# Patient Record
Sex: Female | Born: 1965 | Race: White | Hispanic: No | Smoking: Current every day smoker
Health system: Southern US, Community
[De-identification: ages and names within clinical notes are randomized; demographics above are authoritative.]

## PROBLEM LIST (undated history)

## (undated) DIAGNOSIS — F319 Bipolar disorder, unspecified: Secondary | ICD-10-CM

## (undated) DIAGNOSIS — F329 Major depressive disorder, single episode, unspecified: Secondary | ICD-10-CM

## (undated) DIAGNOSIS — E7800 Pure hypercholesterolemia, unspecified: Secondary | ICD-10-CM

## (undated) DIAGNOSIS — F32A Depression, unspecified: Secondary | ICD-10-CM

## (undated) HISTORY — PX: NECK SURGERY: SHX720

## (undated) HISTORY — PX: CHOLECYSTECTOMY: SHX55

## (undated) HISTORY — PX: APPENDECTOMY: SHX54

## (undated) HISTORY — PX: KNEE SURGERY: SHX244

## (undated) HISTORY — PX: OTHER SURGICAL HISTORY: SHX169

---

## 1997-05-01 ENCOUNTER — Encounter: Admission: RE | Admit: 1997-05-01 | Discharge: 1997-07-30 | Payer: Self-pay | Admitting: Obstetrics and Gynecology

## 1997-09-13 ENCOUNTER — Encounter (HOSPITAL_COMMUNITY): Admission: RE | Admit: 1997-09-13 | Discharge: 1997-12-12 | Payer: Self-pay | Admitting: *Deleted

## 1997-12-28 ENCOUNTER — Encounter (HOSPITAL_COMMUNITY): Admission: RE | Admit: 1997-12-28 | Discharge: 1998-03-28 | Payer: Self-pay | Admitting: *Deleted

## 1998-01-01 ENCOUNTER — Inpatient Hospital Stay (HOSPITAL_COMMUNITY): Admission: AD | Admit: 1998-01-01 | Discharge: 1998-01-01 | Payer: Self-pay | Admitting: Obstetrics and Gynecology

## 1998-02-11 ENCOUNTER — Inpatient Hospital Stay (HOSPITAL_COMMUNITY): Admission: AD | Admit: 1998-02-11 | Discharge: 1998-02-14 | Payer: Self-pay | Admitting: Obstetrics and Gynecology

## 1998-05-01 ENCOUNTER — Other Ambulatory Visit: Admission: RE | Admit: 1998-05-01 | Discharge: 1998-05-01 | Payer: Self-pay | Admitting: Obstetrics and Gynecology

## 1998-05-03 ENCOUNTER — Encounter (HOSPITAL_COMMUNITY): Admission: RE | Admit: 1998-05-03 | Discharge: 1998-08-01 | Payer: Self-pay | Admitting: *Deleted

## 1998-08-01 ENCOUNTER — Encounter (HOSPITAL_COMMUNITY): Admission: RE | Admit: 1998-08-01 | Discharge: 1998-10-30 | Payer: Self-pay | Admitting: *Deleted

## 1999-03-17 ENCOUNTER — Emergency Department (HOSPITAL_COMMUNITY): Admission: EM | Admit: 1999-03-17 | Discharge: 1999-03-17 | Payer: Self-pay | Admitting: Emergency Medicine

## 1999-06-07 ENCOUNTER — Emergency Department (HOSPITAL_COMMUNITY): Admission: EM | Admit: 1999-06-07 | Discharge: 1999-06-07 | Payer: Self-pay | Admitting: *Deleted

## 1999-09-05 ENCOUNTER — Emergency Department (HOSPITAL_COMMUNITY): Admission: EM | Admit: 1999-09-05 | Discharge: 1999-09-05 | Payer: Self-pay | Admitting: Pediatrics

## 1999-09-26 ENCOUNTER — Emergency Department (HOSPITAL_COMMUNITY): Admission: EM | Admit: 1999-09-26 | Discharge: 1999-09-26 | Payer: Self-pay | Admitting: *Deleted

## 2000-02-24 ENCOUNTER — Emergency Department (HOSPITAL_COMMUNITY): Admission: EM | Admit: 2000-02-24 | Discharge: 2000-02-24 | Payer: Self-pay | Admitting: Emergency Medicine

## 2000-03-02 ENCOUNTER — Emergency Department (HOSPITAL_COMMUNITY): Admission: EM | Admit: 2000-03-02 | Discharge: 2000-03-02 | Payer: Self-pay | Admitting: Emergency Medicine

## 2001-02-08 ENCOUNTER — Inpatient Hospital Stay (HOSPITAL_COMMUNITY): Admission: AC | Admit: 2001-02-08 | Discharge: 2001-02-15 | Payer: Self-pay

## 2001-02-08 ENCOUNTER — Encounter: Payer: Self-pay | Admitting: General Surgery

## 2001-02-08 ENCOUNTER — Encounter: Payer: Self-pay | Admitting: Orthopedic Surgery

## 2001-03-25 ENCOUNTER — Ambulatory Visit (HOSPITAL_BASED_OUTPATIENT_CLINIC_OR_DEPARTMENT_OTHER): Admission: RE | Admit: 2001-03-25 | Discharge: 2001-03-26 | Payer: Self-pay | Admitting: Orthopedic Surgery

## 2002-10-11 ENCOUNTER — Inpatient Hospital Stay (HOSPITAL_COMMUNITY): Admission: AD | Admit: 2002-10-11 | Discharge: 2002-10-12 | Payer: Self-pay | Admitting: *Deleted

## 2002-10-12 ENCOUNTER — Encounter: Payer: Self-pay | Admitting: *Deleted

## 2004-05-05 ENCOUNTER — Emergency Department (HOSPITAL_COMMUNITY): Admission: EM | Admit: 2004-05-05 | Discharge: 2004-05-06 | Payer: Self-pay | Admitting: Emergency Medicine

## 2004-12-21 ENCOUNTER — Emergency Department (HOSPITAL_COMMUNITY): Admission: EM | Admit: 2004-12-21 | Discharge: 2004-12-21 | Payer: Self-pay

## 2005-01-20 ENCOUNTER — Emergency Department (HOSPITAL_COMMUNITY): Admission: EM | Admit: 2005-01-20 | Discharge: 2005-01-20 | Payer: Self-pay | Admitting: Emergency Medicine

## 2006-09-14 ENCOUNTER — Emergency Department (HOSPITAL_COMMUNITY): Admission: EM | Admit: 2006-09-14 | Discharge: 2006-09-14 | Payer: Self-pay | Admitting: Emergency Medicine

## 2006-12-03 ENCOUNTER — Inpatient Hospital Stay (HOSPITAL_COMMUNITY): Admission: AD | Admit: 2006-12-03 | Discharge: 2006-12-03 | Payer: Self-pay | Admitting: Obstetrics and Gynecology

## 2006-12-04 ENCOUNTER — Ambulatory Visit (HOSPITAL_COMMUNITY): Admission: RE | Admit: 2006-12-04 | Discharge: 2006-12-04 | Payer: Self-pay | Admitting: Obstetrics and Gynecology

## 2006-12-04 ENCOUNTER — Encounter (INDEPENDENT_AMBULATORY_CARE_PROVIDER_SITE_OTHER): Payer: Self-pay | Admitting: Obstetrics and Gynecology

## 2006-12-21 ENCOUNTER — Ambulatory Visit (HOSPITAL_COMMUNITY): Admission: RE | Admit: 2006-12-21 | Discharge: 2006-12-21 | Payer: Self-pay | Admitting: General Surgery

## 2006-12-21 ENCOUNTER — Encounter (INDEPENDENT_AMBULATORY_CARE_PROVIDER_SITE_OTHER): Payer: Self-pay | Admitting: General Surgery

## 2007-11-02 ENCOUNTER — Ambulatory Visit: Payer: Self-pay | Admitting: Internal Medicine

## 2007-11-02 DIAGNOSIS — R198 Other specified symptoms and signs involving the digestive system and abdomen: Secondary | ICD-10-CM

## 2007-11-02 DIAGNOSIS — R109 Unspecified abdominal pain: Secondary | ICD-10-CM

## 2007-11-03 ENCOUNTER — Encounter (INDEPENDENT_AMBULATORY_CARE_PROVIDER_SITE_OTHER): Payer: Self-pay

## 2007-11-03 LAB — CONVERTED CEMR LAB
AST: 20 units/L (ref 0–37)
Albumin: 4 g/dL (ref 3.5–5.2)
BUN: 11 mg/dL (ref 6–23)
Basophils Relative: 0.1 % (ref 0.0–3.0)
CO2: 27 meq/L (ref 19–32)
Calcium: 9.2 mg/dL (ref 8.4–10.5)
Chloride: 106 meq/L (ref 96–112)
Creatinine, Ser: 0.8 mg/dL (ref 0.4–1.2)
Eosinophils Absolute: 0.1 10*3/uL (ref 0.0–0.7)
Eosinophils Relative: 1.3 % (ref 0.0–5.0)
GFR calc Af Amer: 103 mL/min
Glucose, Bld: 116 mg/dL — ABNORMAL HIGH (ref 70–99)
Lymphocytes Relative: 26.7 % (ref 12.0–46.0)
Monocytes Relative: 4.7 % (ref 3.0–12.0)
Neutrophils Relative %: 67.2 % (ref 43.0–77.0)
Platelets: 250 10*3/uL (ref 150–400)
Potassium: 4.3 meq/L (ref 3.5–5.1)
RBC: 4.09 M/uL (ref 3.87–5.11)
TSH: 1.62 microintl units/mL (ref 0.35–5.50)
WBC: 7.6 10*3/uL (ref 4.5–10.5)

## 2007-11-29 ENCOUNTER — Ambulatory Visit: Payer: Self-pay | Admitting: Internal Medicine

## 2009-02-13 ENCOUNTER — Encounter: Admission: RE | Admit: 2009-02-13 | Discharge: 2009-03-29 | Payer: Self-pay | Admitting: Orthopaedic Surgery

## 2009-08-04 ENCOUNTER — Emergency Department (HOSPITAL_BASED_OUTPATIENT_CLINIC_OR_DEPARTMENT_OTHER): Admission: EM | Admit: 2009-08-04 | Discharge: 2009-08-05 | Payer: Self-pay | Admitting: Emergency Medicine

## 2010-02-28 ENCOUNTER — Ambulatory Visit (HOSPITAL_COMMUNITY)
Admission: RE | Admit: 2010-02-28 | Discharge: 2010-02-28 | Payer: Self-pay | Source: Home / Self Care | Admitting: Obstetrics and Gynecology

## 2010-04-17 ENCOUNTER — Encounter
Admission: RE | Admit: 2010-04-17 | Discharge: 2010-04-30 | Payer: Self-pay | Source: Home / Self Care | Attending: Obstetrics and Gynecology | Admitting: Obstetrics and Gynecology

## 2010-05-07 ENCOUNTER — Emergency Department (HOSPITAL_BASED_OUTPATIENT_CLINIC_OR_DEPARTMENT_OTHER)
Admission: EM | Admit: 2010-05-07 | Discharge: 2010-05-07 | Disposition: A | Discharge status: Home / Self Care | Payer: Medicaid Other | Attending: Emergency Medicine | Admitting: Emergency Medicine

## 2010-05-07 ENCOUNTER — Emergency Department (INDEPENDENT_AMBULATORY_CARE_PROVIDER_SITE_OTHER): Payer: Medicaid Other

## 2010-05-07 DIAGNOSIS — M5030 Other cervical disc degeneration, unspecified cervical region: Secondary | ICD-10-CM | POA: Insufficient documentation

## 2010-05-07 DIAGNOSIS — M25519 Pain in unspecified shoulder: Secondary | ICD-10-CM | POA: Insufficient documentation

## 2010-05-07 DIAGNOSIS — M19019 Primary osteoarthritis, unspecified shoulder: Secondary | ICD-10-CM | POA: Insufficient documentation

## 2010-05-07 DIAGNOSIS — M542 Cervicalgia: Secondary | ICD-10-CM

## 2010-05-07 DIAGNOSIS — G5690 Unspecified mononeuropathy of unspecified upper limb: Secondary | ICD-10-CM | POA: Insufficient documentation

## 2010-05-07 DIAGNOSIS — R209 Unspecified disturbances of skin sensation: Secondary | ICD-10-CM | POA: Insufficient documentation

## 2010-05-15 ENCOUNTER — Ambulatory Visit: Payer: Self-pay | Admitting: *Deleted

## 2010-05-17 ENCOUNTER — Emergency Department (HOSPITAL_BASED_OUTPATIENT_CLINIC_OR_DEPARTMENT_OTHER)
Admission: EM | Admit: 2010-05-17 | Discharge: 2010-05-17 | Disposition: A | Discharge status: Home / Self Care | Payer: Medicaid Other | Attending: Emergency Medicine | Admitting: Emergency Medicine

## 2010-05-17 DIAGNOSIS — M25519 Pain in unspecified shoulder: Secondary | ICD-10-CM | POA: Insufficient documentation

## 2010-05-17 DIAGNOSIS — R209 Unspecified disturbances of skin sensation: Secondary | ICD-10-CM | POA: Insufficient documentation

## 2010-08-13 NOTE — H&P (Signed)
[name], SCOTTI NO.:  [id number]   MEDICAL RECORD NO.:  [id number]           PATIENT TYPE:  AMB   LOCATION:  DAY                           FACILITY:  [facility]   PHYSICIAN:  Dalia Heading, M.D.  DATE OF BIRTH:  06/02/1965   DATE OF ADMISSION:  DATE OF DISCHARGE:  LH                              HISTORY & PHYSICAL   CHIEF COMPLAINT:  Cholecystitis, cholelithiasis.   HISTORY OF PRESENT ILLNESS:  The patient is a 45 year old white female  who is referred for evaluation and treatment of biliary colic secondary  to cholelithiasis.  She has been having right upper quadrant abdominal  pain, nausea, and bloating for many years.  She does have fatty food  intolerance.  No fever, chills, jaundice have been noted.   PAST MEDICAL HISTORY:  Includes a recent miscarriage.   SURGICAL HISTORY:  Appendectomy, c-section, dilatation and extraction  recently, arm and knee surgeries.   CURRENT MEDICATIONS:  None.   ALLERGIES:  NO KNOWN DRUG ALLERGIES.   REVIEW OF SYSTEMS:  The patient smokes a pack of cigarettes a day.  She  denies any alcohol abuse.  She denies any other cardiopulmonary  difficulties or bleeding disorders.   PHYSICAL EXAMINATION:  GENERAL:  The patient is a well-developed, well-  nourished white female in no acute distress.  HEENT:  Reveals no scleral icterus.  LUNGS:  Clear to auscultation with equal breath sounds bilaterally.  HEART:  Examination reveals a regular rate and rhythm without S3, S4 or  murmurs.  ABDOMEN:  Soft and nondistended.  She is tender in the right upper  quadrant to palpation.  No hepatosplenomegaly, masses, or hernias are  identified.  Ultrasound of the gallbladder reveals cholelithiasis with a  dilated common bile duct.   IMPRESSION:  Cholecystitis, cholelithiasis.   PLAN:  The patient is scheduled for a laparoscopic cholecystectomy with  intraoperative cholangiograms at December 21, 2006.  The risks and  benefits of the  procedure including bleeding, infection, hepatobiliary  injury and the possibility of an open procedure were fully explained to  the patient, gave informed consent.      Dalia Heading, M.D.  Electronically Signed     MAJ/MEDQ  D:  12/15/2006  T:  12/16/2006  Job:  [id number]   cc:   Dalia Heading, M.D.  Fax: [phone]

## 2010-08-13 NOTE — Op Note (Signed)
[name], [name]                  ACCOUNT NO.:  [id number]   MEDICAL RECORD NO.:  [id number]          PATIENT TYPE:  AMB   LOCATION:  SDC                           FACILITY:  [facility]   PHYSICIAN:  Dineen Kid. Rana Snare, M.D.    DATE OF BIRTH:  April 18, 1965   DATE OF PROCEDURE:  12/04/2006  DATE OF DISCHARGE:                               OPERATIVE REPORT   PREOPERATIVE DIAGNOSIS:  Embryonic demise of 6.5 weeks' gestational age.   POSTOPERATIVE DIAGNOSIS:  Embryonic demise of 6.5 weeks' gestational  age.   PROCEDURE:  Dilation evacuation.   SURGEON:  Candice Camp, MD.   ANESTHESIA:  Monitored anesthesia care and paracervical block.   INDICATIONS FOR PROCEDURE:  The patient is a 45 year old G7, P3, A2 at  6.5 weeks' gestational age who presents with vaginal bleeding.  Ultrasound shows embryonic demise. She desires dilation/evacuation. The  risks and benefits were discussed at length. Informed consent was  obtained. She is Rh positive.   DESCRIPTION OF PROCEDURE:  After adequate analgesia, the patient was  placed in the dorsal lithotomy position. She was sterilely prepped and  draped, bladder sterilely drained. Speculum was placed. Tenaculum placed  on the anterior lip of the cervix. Paracervical block was placed with 1%  Xylocaine, 1:100,000 epinephrine, 20 mL total used.   The uterus was sounded to 10 cm, easily dilated to a #27 Pratt dilator,  8-mm suction curet was inserted, products of conception were retrieved,  curettage was performed until gritty surface felt throughout the  endometrial cavity and no residual products of conception being  retrieved.   The patient received 0.2 mg of Methergine IM with good uterine response.  The tenaculum was then removed from the cervix, noted to be hemostatic.  The speculum was removed. The patient was transferred to the recovery  room in stable condition. Sponge, instrument counts were normal x3.   ESTIMATED BLOOD LOSS:  Minimal.   The patient  received Toradol 30 mg IV postoperatively   DISPOSITION:  The patient will be discharged home. Will follow up in the  office in 2 to 3 weeks. Sent home with a routine instruction sheet for D  and E. Told to return for increased pain, fever or bleeding. Sent home  with a  prescription for Methergine 0.2 mg to take every 8 hours for 3 days,  doxycycline 100 mg orally b.i.d. x7 days and Vicodin to take as needed  and ibuprofen for pain. I did give her 30 of these because she is also  having cholestasis and is supposed to follow up with general surgeon for  evaluation of that.      Dineen Kid Rana Snare, M.D.  Electronically Signed     DCL/MEDQ  D:  12/04/2006  T:  12/04/2006  Job:  [id number]

## 2010-08-13 NOTE — Op Note (Signed)
[name], [name] NO.:  [id number]   MEDICAL RECORD NO.:  [id number]          PATIENT TYPE:  AMB   LOCATION:  DAY                           FACILITY:  [facility]   PHYSICIAN:  Dalia Heading, M.D.  DATE OF BIRTH:  10/04/1965   DATE OF PROCEDURE:  DATE OF DISCHARGE:                               OPERATIVE REPORT   PREOPERATIVE DIAGNOSIS:  Cholecystitis, cholelithiasis.   POSTOPERATIVE DIAGNOSIS:  Cholecystitis, cholelithiasis.   PROCEDURE:  Laparoscopic cholecystectomy with cholangiograms.   SURGEON:  Dalia Heading, M.D.   ANESTHESIA:  General endotracheal.   INDICATIONS:  The patient is a 45 year old white female who presents  with cholecystitis secondary to cholelithiasis.  She also has a dilated  common bile duct.  The patient now presents for laparoscopic  cholecystectomy with cholangiograms.  The risks and benefits of the  procedure including bleeding, infection, hepatobiliary injury, and the  possibly an open procedure were fully explained to the patient who gave  informed consent.   PROCEDURE NOTE:  The patient was placed in the supine position.  After  induction of general endotracheal anesthesia, the abdomen was prepped  and draped using the usual sterile technique with Betadine.  Surgical  site confirmation was performed.   A supraumbilical incision was made down to the fascia.  A Veress needle  was introduced into the abdominal cavity and confirmation of placement  was done using the saline drop test.  The abdomen was then insufflated  to 16 mmHg pressure.  An 11-mm trocar was introduced into the abdominal  cavity under direct visualization without difficulty.  The patient was  placed in reversed Trendelenburg position, and an additional 11-mm  trocar was placed in the epigastric region and 5-mm trocars placed in  the right upper quadrant and right flank regions.  The liver was  inspected and noted to be within normal limits.  The gallbladder  was  retracted superiorly and  laterally.  The dissection was begun around  the infundibulum of the gallbladder.  The cystic duct was first  identified.  Its juncture to the infundibulum fully identified.  A  single endoclip was placed proximally on the cystic duct.  An incision  was made into the cystic duct and the cholangiocatheter was inserted.  Under digital fluoroscopy, cholangiograms were performed.  The dye  flowed freely into the duodenum.  There were no hepatobiliary defects.  The system was flushed with normal saline and the catheter was removed.  Multiple endoclips were placed distally on the cystic duct and the  cystic duct was divided.  The cystic artery was likewise ligated and  divided.  The gallbladder was freed away from the gallbladder fossa  using Bovie electrocautery.  The gallbladder was delivered through the  epigastric trocar site using an EndoCatch bag.  The gallbladder fossa  was inspected.  No abnormal bleeding or bile leakage was noted.  Surgicel was placed in the gallbladder fossa.  All fluid and air were  then evacuated from the abdominal cavity prior to removal of the  trocars.   All wounds were  irrigated with normal saline.  All wounds were injected  with 0.5% Sensorcaine.  The supraumbilical fascia was reapproximated  using an 0 Vicryl interrupted suture.  All skin incisions were closed  using staples.  Betadine ointment and dry sterile dressings were  applied.   All tape and needle counts were correct at the end of the procedure.  The patient was extubated in the operating room and went back to the  recovery room awake and in stable condition.   COMPLICATIONS:  None.   SPECIMEN:  Gallbladder with stones.   ESTIMATED BLOOD LOSS:  Minimal.      Dalia Heading, M.D.  Electronically Signed     MAJ/MEDQ  D:  12/21/2006  T:  12/21/2006  Job:  [id number]

## 2010-08-16 NOTE — Op Note (Signed)
[place]. [facility]  Patient:    [name], [name] Visit Number: [id number] MRN: [id number]          Service Type: DSU Location: [facility] Attending Physician:  Burnard Bunting Dictated by:   Cammy Copa, M.D. Proc. Date: 03/25/01 Admit Date:  03/25/2001                             Operative Report  PREOPERATIVE DIAGNOSIS:  Right knee anterior cruciate ligament tear with a partial medial collateral ligament sprain.  POSTOPERATIVE DIAGNOSIS:  Right knee anterior cruciate ligament tear, medial collateral ligament sprain grade 1 and patellofemoral chondromalacia.  OPERATION PERFORMED:  Right knee anterior cruciate ligament reconstruction with allograft and debridement of undersurface of the patella.  SURGEON:  Cammy Copa, M.D.  ANESTHESIA:  General endotracheal plus postoperative femoral block.  TOURNIQUET TIME:  One hour 28 minutes at 300 mHg.  OPERATIVE FINDINGS:  Examination under anesthesia, range of motion 3 degrees hyperextension, 2 full flexion with about 2 to 3 mm of opening to varus stress at 0 degrees, about 4 to 5 mm of opening at 30 degrees.  LCL and PCL were intact.  The patient had positive pivot shift and Lachman with greater than 5 mm anterior translation of soft end point.  Diagnostic arthroscopy: 1. Grade 2 chondromalacia on the undersurface of the patella over a    7 x 7 mm area at the junction of the medial and lateral facets. 2. Intact trochlear articular cartilage. 3. No loose bodies in the medial and lateral gutter. 4. Complete anterior cruciate ligament tear with detachment off of the    femur. 5. Intact posterior cruciate ligament. 6. Partial thickness lateral meniscal tear which was stable and posterior    to the popliteus hiatus.  Lateral compartment cartilage was intact. 7. Intact medial meniscus with intact medial compartment articular cartilage.  DESCRIPTION OF PROCEDURE:  The patient was taken to the  operating room where general endotracheal anesthesia was induced.  Preoperative intravenous antibiotics were administered.  The right leg was examined under anesthesia. The right leg including the foot was prepped with DuraPrep solution and draped in a sterile manner.  Based on the preoperative exam, history and intraoperative examination, a patella tendon allograft was prepared.  The allograft had a tendon which was 11 to 12 mm thick but the bone plugs were fashioned to be 9 mm thick x 20 mm.  The two #5 fiberwire sutures were placed through what would be the tibial side and one #5 Ethibond was placed through the nose of what would be the femoral bone plug.  The anterior inferolateral portal was first established.  Anterior inferior medial portal was then established under direct visualization.  Debridement of the intercondylar notch was performed.  The anterior cruciate ligament stump was identified and was debrided.  Soft tissue from the lateral wall of the notch was debrided and the over-the-top position was identified.  Using a bur the notch was changed from an A to a U and about 2 to 3 mm of the lateral wall was removed.  The over-the-top position was again identified.  The remaining aspect of the knee was examined arthroscopically and the rest of the knee was examined arthroscopically with the only major findings being some grade 2 chondromalacia on the undersurface of the patella with no corresponding trochlear changes.  The medial and lateral gutters were free of loose bodies, the partial  thickness lateral meniscal tear which posterior to the popliteus. The medial meniscus was intact and the medial and lateral compartment articular cartilage was intact.  The stump of the anterior cruciate ligament was debrided.  Using a tibial guide set on 50 mm, the tibial tunnel was drilled at the posterior aspect of the native ACL footprint.  The 7 mm over-the-top guide was then placed with the  notch in the over-the-top position.  The 9 mm reamer which was used for the tibia was then used for the femur.  The Beeth pin was then passed and the graft was then passed into the tunnel. The fixation was achieved on the femoral side with a 7 x 23 mm interference screw, bioabsorbable.  The knee was taken through a range of motion and the graft was found not to impinge and was found to have excellent stability with negative anesthesia check for graft pull-out.  The leg was then taken through about 10 degrees of flexion and secured over a 25 mm tibial post with the two fiberwire sutures.  Recheck on the graft demonstrated good tension and full range of motion.  At this time the suture was removed from the proximal aspect of the thigh and the sutures were cut distally.  The incision and the knee were thoroughly irrigated.  The tourniquet was released and the incisions were closed using 2-0 Vicryl in the portals, 2-0 inverted Vicryl in the portals and the small skin incision superior to the path using the screw and the skin was closed using 3-0 Prolene.  The patient tolerated the procedure well and had perfused foot at the conclusion of the case. Femoral block was administered.  A knee immobilizer was placed.  The patient was transferred to the recovery room in stable condition. Dictated by:   Cammy Copa, M.D. Attending Physician:  Burnard Bunting DD:  03/25/01 TD:  03/25/01 Job: [id number]
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
## 2010-08-16 NOTE — H&P (Signed)
[name], [name] NO.:  [id number]   MEDICAL RECORD NO.:  [id number]          PATIENT TYPE:  AMB   LOCATION:  DAY                           FACILITY:  [facility]   PHYSICIAN:  Dalia Heading, M.D.  DATE OF BIRTH:  Nov 20, 1965   DATE OF ADMISSION:  DATE OF DISCHARGE:  LH                              HISTORY & PHYSICAL   CHIEF COMPLAINT:  Cholecystitis, cholelithiasis.   HISTORY OF PRESENT ILLNESS:  The patient is a 45 year old white female  who is referred for evaluation and treatment of biliary colic secondary  to cholelithiasis.  She has been having right upper quadrant abdominal  pain, nausea, and bloating for many years.  She does have fatty food  intolerance.  No fever, chills, jaundice have been noted.   PAST MEDICAL HISTORY:  Includes a recent miscarriage.   SURGICAL HISTORY:  Appendectomy, c-section, dilatation and extraction  recently, arm and knee surgeries.   CURRENT MEDICATIONS:  None.   ALLERGIES:  NO KNOWN DRUG ALLERGIES.   REVIEW OF SYSTEMS:  The patient smokes a pack of cigarettes a day.  She  denies any alcohol abuse.  She denies any other cardiopulmonary  difficulties or bleeding disorders.   PHYSICAL EXAMINATION:  GENERAL:  The patient is a well-developed, well-  nourished white female in no acute distress.  HEENT:  Reveals no scleral icterus.  LUNGS:  Clear to auscultation with equal breath sounds bilaterally.  HEART:  Examination reveals a regular rate and rhythm without S3, S4 or  murmurs.  ABDOMEN:  Soft and nondistended.  She is tender in the right upper  quadrant to palpation.  No hepatosplenomegaly, masses, or hernias are  identified.  Ultrasound of the gallbladder reveals cholelithiasis with a  dilated common bile duct.   IMPRESSION:  Cholecystitis, cholelithiasis.   PLAN:  The patient is scheduled for a laparoscopic cholecystectomy with  intraoperative cholangiograms at December 21, 2006.  The risks and  benefits of the  procedure including bleeding, infection, hepatobiliary  injury and the possibility of an open procedure were fully explained to  the patient, gave informed consent.      Dalia Heading, M.D.  Electronically Signed     MAJ/MEDQ  D:  12/15/2006  T:  12/16/2006  Job:  [id number]   cc:   Dalia Heading, M.D.  Fax: [phone]

## 2010-08-16 NOTE — Op Note (Signed)
[place]  Patient:    Marylin Crosby Visit Number: [id number] MRN: [id number]          Service Type: TRA Location: [id number] Attending Physician:  Trauma, Md Dictated by:   Cammy Copa, M.D. Proc. Date: 02/08/01 Admit Date:  02/08/2001                             Operative Report  PREOPERATIVE DIAGNOSIS:  Left radius fracture.  POSTOPERATIVE DIAGNOSIS:  Left radius fracture.  OPERATION: Open reduction and internal fixation of the left radius fracture.  SURGEON:  Cammy Copa, M.D.  ANESTHESIA:  General endotracheal.  ESTIMATED BLOOD LOSS:  25 cc.  DRAINS:  None.  TOURNIQUET TIME:  One hour 16 minutes at 250 mmHg.  DESCRIPTION OF PROCEDURE:  The patient was brought to the operating room where a general endotracheal anesthetic was induced.  Preoperative IV antibiotics were administered.  The left forearm was prescrubbed with Betadine and then dried, and then prepped with DuraPrep solution and draped in a sterile manner. Proximal arm tourniquet was used.  The Collier Flowers was used to cover the operative field.  The arm was elevated but not exsanguinated, and the tourniquet was inflated.  This preserved the venous comitantes for radial artery identification.  The volar Sherilyn Cooter approach was utilized.  A 15 cm incision was made, centered over the fracture.  The skin and subcutaneous tissue was sharply divided.  The superficial nerve was identified and was mobilized laterally.  Brachial radialis interval between the brachial radialis and the FCR was identified distally within the incision.  Dissection was then carried proximally.  The fascia was divided in the plane between the FCR and the brachial radialis and more proximally between the brachial radialis and pronator.  Hematoma was encountered.  Superficial radial nerve was identified underneath the brachial radialis.  It was not dissected and was protected during the case.   The radial artery was identified also and was mobilized ulnarly.  Communicating branches to the brachial radialis were cauterized with bipolar electrocautery.  The distal fragment edge was identified.  The arm was pronated and the pronator terres was subperiosteally elevated.  This gave a very good view of the distal end of the fragment fracture.  The proximal end was then supinated and the bicipital tuberosity was palpated.  The supinator was then detached by first palpating the bursae between the bicipital attachment to the radial tuberosity was palpated.  It was then developed, and the supinator was detached and mobilized laterally.  The radial artery was protected during this maneuver.  Careful subperiosteal dissection was performed.  The retractors were not placed on the posterior aspect of the radial neck or the radius.  At this time, the fracture was anatomically reduced.  There was a small fragment measuring 3 x 4 mm which was teased back into position.  Using standard AS technique, six cortices were achieved proximally, eight cortices were achieved distally.  The reduction was anatomic on the AP and lateral x-ray.  The screws were not too prominent on the dorsal aspect of the forearm.  The arm was taken through a range of motion.  The distal radial ulnar joint was felt to be stable.  The patient had full pronation and supination.  At this time, the incision was irrigated, and the tourniquet was released.  Bleeding points were controlled using electrocautery and radial artery pulse nicely.  The fascia  was not closed, and the skin was then closed using interrupted inverted 2-0 Vicryl suture followed by interrupted running 3-0 nylon suture.  The patient was placed in a bulky splint.  She was extubated and transferred to the recovery room in stable condition. Dictated by:   Cammy Copa, M.D. Attending Physician:  Trauma, Md DD:  02/08/01 TD:  02/09/01 Job:  [id number]

## 2010-09-09 ENCOUNTER — Encounter (HOSPITAL_COMMUNITY)
Admission: RE | Admit: 2010-09-09 | Discharge: 2010-09-09 | Disposition: A | Discharge status: Home / Self Care | Payer: Medicaid Other | Source: Ambulatory Visit | Attending: Orthopedic Surgery | Admitting: Orthopedic Surgery

## 2010-09-09 ENCOUNTER — Other Ambulatory Visit (HOSPITAL_COMMUNITY): Payer: Self-pay | Admitting: Orthopedic Surgery

## 2010-09-09 DIAGNOSIS — Z01811 Encounter for preprocedural respiratory examination: Secondary | ICD-10-CM

## 2010-09-09 DIAGNOSIS — Z01818 Encounter for other preprocedural examination: Secondary | ICD-10-CM

## 2010-09-09 LAB — HCG, SERUM, QUALITATIVE: Preg, Serum: NEGATIVE

## 2010-09-09 LAB — BASIC METABOLIC PANEL
BUN: 9 mg/dL (ref 6–23)
CO2: 31 mEq/L (ref 19–32)
Chloride: 100 mEq/L (ref 96–112)
GFR calc Af Amer: >60 mL/min (ref >60)
Potassium: 4 mEq/L (ref 3.5–5.1)

## 2010-09-09 LAB — CBC
HCT: 38.9 % (ref 36.0–46.0)
RBC: 4.13 MIL/uL (ref 3.87–5.11)
RDW: 13 % (ref 11.5–15.5)
WBC: 6.7 10*3/uL (ref 4.0–10.5)

## 2010-09-09 LAB — SURGICAL PCR SCREEN: Staphylococcus aureus: NEGATIVE

## 2010-09-11 ENCOUNTER — Ambulatory Visit (HOSPITAL_COMMUNITY): Payer: Medicaid Other

## 2010-09-11 ENCOUNTER — Inpatient Hospital Stay (HOSPITAL_COMMUNITY)
Admission: RE | Admit: 2010-09-11 | Discharge: 2010-09-12 | DRG: 473 | Disposition: A | Discharge status: Home / Self Care | Payer: Medicaid Other | Source: Ambulatory Visit | Attending: Orthopedic Surgery | Admitting: Orthopedic Surgery

## 2010-09-11 DIAGNOSIS — F319 Bipolar disorder, unspecified: Secondary | ICD-10-CM | POA: Diagnosis present

## 2010-09-11 DIAGNOSIS — M47812 Spondylosis without myelopathy or radiculopathy, cervical region: Principal | ICD-10-CM | POA: Diagnosis present

## 2010-09-11 DIAGNOSIS — E119 Type 2 diabetes mellitus without complications: Secondary | ICD-10-CM | POA: Diagnosis present

## 2010-09-11 DIAGNOSIS — F17200 Nicotine dependence, unspecified, uncomplicated: Secondary | ICD-10-CM | POA: Diagnosis present

## 2010-09-11 DIAGNOSIS — Z01812 Encounter for preprocedural laboratory examination: Secondary | ICD-10-CM

## 2010-09-11 DIAGNOSIS — Z01810 Encounter for preprocedural cardiovascular examination: Secondary | ICD-10-CM

## 2010-09-11 DIAGNOSIS — Z01818 Encounter for other preprocedural examination: Secondary | ICD-10-CM

## 2010-09-11 LAB — GLUCOSE, CAPILLARY: Glucose-Capillary: 162 mg/dL — ABNORMAL HIGH (ref 70–99)

## 2010-09-12 LAB — GLUCOSE, CAPILLARY: Glucose-Capillary: 197 mg/dL — ABNORMAL HIGH (ref 70–99)

## 2010-09-15 NOTE — Op Note (Signed)
[name], [name] NO.:  [id number]  MEDICAL RECORD NO.:  [id number]  LOCATION:  5038                         FACILITY:  [facility]  PHYSICIAN:  Alvy Beal, MD    DATE OF BIRTH:  Jan 02, 1966  DATE OF PROCEDURE:  09/11/2010 DATE OF DISCHARGE:                              OPERATIVE REPORT   PREOPERATIVE DIAGNOSIS:  Cervical spondylotic radiculopathy.  POSTOPERATIVE DIAGNOSIS:  Cervical spondylotic radiculopathy.  OPERATIVE PROCEDURE:  Anterior cervical diskectomy and fusion C5-6 and C6-7.  COMPLICATIONS:  None.  CONDITION:  Stable.  INSTRUMENTATION SYSTEM USED:  Synthes anterior cervical Vector plate 28 mm in length with 14-mm self-drilling screws and tightened titanium intervertebral graft, size 7 small, packed with cortical cancellous bone chips mixed with DBX at C6-7 and a 7 medium lordotic packed with cortical cancellous bone chips at C5-6.  HISTORY:  Courtney [name] is a very pleasant 45 year old woman who has been having significant neck and radicular arm pain.  Despite appropriate conservative management consisting of physical therapy, anti- inflammatory, narcotic medications, injection therapy, activity modification, she continued to have disabling pain with poor quality of life.  As a result, we elected to proceed with surgical intervention. All appropriate risks, benefits, and alternatives to surgery were discussed with the patient.  Consent was obtained.  OPERATIVE NOTE:  The patient was brought to the operating theater and placed supine on the operating table.  After successful induction of general anesthesia and endotracheal intubation, TEDs and SCDs were applied.  Rolled towels were placed between the shoulder blades and the shoulders were taped down for visualization.  The anterior cervical spine was prepped and draped in a standard fashion.  Appropriate time- out was then done to confirm patient, procedure, affected extremity, and all other  pertinent important data.  Once this was completed, a transverse incision starting at midline going to the left centered over the C6-7 disk space was made.  Sharp dissection was carried out down to the platysma.  Platysma was sharply incised.  Blunt dissection was then carried out through the deep cervical and prevertebral fascia.  I identified the omohyoid muscle, released it from its sling to allow for better visualization.  The trachea and esophagus were palpated and mobilized with a finger to the right and the carotid artery was palpated and protected with a finger laterally.  A retractor was placed into the wound and then I mobilized the longus colli muscles from the midbody of C5 to the midbody of C7.  Once I had the C5-6 and C6-7 disk space identified, I placed a needle into the C5-6 disk space to again confirm that this was the appropriate level.  Once I had confirmed this, the distraction pins were placed into the bodies of C6 and C7 and C6-7 disk space was distracted.  An annulotomy was performed with a 15-blade scalpel and using a combination of pituitary rongeurs, curettes, and Kerrison rongeurs, I removed all the intervertebral disk.  I then used fine nerve hooks and curettes to release the annulus posteriorly.  I then used a 1-mm Kerrison to resect the posterior annulus as well as posterior longitudinal ligament.  This allowed direct visualization of  the anterior thecal sac.  I then debrided some of the osteophytes from the uncovertebral joint with a 1-mm Kerrison as well.  At this point, I rasped the endplates.  Satisfied with the decompression and diskectomy that had completed, I then trialed the intervertebral space.  I elected to use a 7 small lordotic Titan titanium cage, packed with cortical cancellous bone chips mixed with DBX.  I had good fit.  At this point, I then repositioned the retractors and then repeated the entire diskectomy at C5-6.  Using the same technique,  I again released the posterior longitudinal ligament and made sure I had an adequate decompression and diskectomy.  Once this was done and had bleeding endplates, I again trialed intervertebral spacers and elected to use the 7 medium lordotic Titan cage.  With this proper fix, I then removed the distraction pins and placed a 28-mm anterior cervical plate that I had contoured for the cervical lordosis.  I then secured it with 14-mm lag screws, self- drilling screws, at C5, C6, and C7.  At this point, once the plate was secured in position, I torqued down all the screws to make sure they were properly tightened.  I then checked to ensure the esophagus did not become entrapped beneath the plate.  Once I was sure that the esophagus was free of the plate, I returned back to the midline along with the trachea.  I then irrigated copiously with normal saline, made sure I had hemostasis with bipolar electrocautery, and then closed the platysma with interrupted 2-0 Vicryl suture and a 3-0 Monocryl for the skin. Steri-Strips and dry dressings were applied.  The patient was extubated and transferred to PACU without incident.  At the end of the case,needle and sponge counts were correct.     Alvy Beal, MD     DDB/MEDQ  D:  09/11/2010  T:  09/12/2010  Job:  [id number]  Electronically Signed by Venita Lick MD on 09/15/2010 07:23:15 AM

## 2010-12-21 ENCOUNTER — Emergency Department (HOSPITAL_COMMUNITY)
Admission: EM | Admit: 2010-12-21 | Discharge: 2010-12-22 | Disposition: A | Discharge status: Home / Self Care | Payer: Medicaid Other | Attending: Emergency Medicine | Admitting: Emergency Medicine

## 2010-12-21 ENCOUNTER — Encounter: Payer: Self-pay | Admitting: *Deleted

## 2010-12-21 DIAGNOSIS — E7800 Pure hypercholesterolemia, unspecified: Secondary | ICD-10-CM | POA: Insufficient documentation

## 2010-12-21 DIAGNOSIS — G8929 Other chronic pain: Secondary | ICD-10-CM | POA: Insufficient documentation

## 2010-12-21 DIAGNOSIS — F17200 Nicotine dependence, unspecified, uncomplicated: Secondary | ICD-10-CM | POA: Insufficient documentation

## 2010-12-21 DIAGNOSIS — F319 Bipolar disorder, unspecified: Secondary | ICD-10-CM | POA: Insufficient documentation

## 2010-12-21 DIAGNOSIS — E119 Type 2 diabetes mellitus without complications: Secondary | ICD-10-CM | POA: Insufficient documentation

## 2010-12-21 DIAGNOSIS — F1910 Other psychoactive substance abuse, uncomplicated: Secondary | ICD-10-CM

## 2010-12-21 HISTORY — DX: Bipolar disorder, unspecified: F31.9

## 2010-12-21 HISTORY — DX: Depression, unspecified: F32.A

## 2010-12-21 HISTORY — DX: Pure hypercholesterolemia, unspecified: E78.00

## 2010-12-21 HISTORY — DX: Major depressive disorder, single episode, unspecified: F32.9

## 2010-12-21 LAB — DIFFERENTIAL
Lymphocytes Relative: 36 % (ref 12–46)
Monocytes Absolute: 0.4 10*3/uL (ref 0.1–1.0)
Monocytes Relative: 6 % (ref 3–12)
Neutro Abs: 3.8 10*3/uL (ref 1.7–7.7)

## 2010-12-21 LAB — RAPID URINE DRUG SCREEN, HOSP PERFORMED
Amphetamines: NOT DETECTED
Benzodiazepines: POSITIVE — AB
Cocaine: NOT DETECTED

## 2010-12-21 LAB — CBC
HCT: 33.7 % — ABNORMAL LOW (ref 36.0–46.0)
Hemoglobin: 11.1 g/dL — ABNORMAL LOW (ref 12.0–15.0)
WBC: 6.9 10*3/uL (ref 4.0–10.5)

## 2010-12-21 LAB — BASIC METABOLIC PANEL
BUN: 8 mg/dL (ref 6–23)
CO2: 26 mEq/L (ref 19–32)
Chloride: 100 mEq/L (ref 96–112)
Creatinine, Ser: 0.69 mg/dL (ref 0.50–1.10)

## 2010-12-21 NOTE — ED Provider Notes (Signed)
History     CSN: [id number] Arrival date & time: 12/21/2010  9:59 PM Pt seen at 2310 Chief Complaint  Patient presents with  . Medical Clearance    HPI  (Consider location/radiation/quality/duration/timing/severity/associated sxs/prior treatment)  Patient is a 45 y.o. female presenting with drug problem. The history is provided by the patient.  Drug Problem This is a chronic problem. Episode onset: an unknown time ago. The problem occurs constantly. The problem has been gradually worsening. Pertinent negatives include no headaches. The symptoms are aggravated by nothing. The symptoms are relieved by nothing.   Pt has long h/o chronic pain Recently she has increased her use of pain meds, including percocet, methadone, flexeril, diazepam She denies SI and denies wanting to die Family committed patient because she was more confused today, not acting herself, lying in the bed, incoherent and they were concerned that she has increased her drug intake.    Pt last took meds earlier today Only took one percocet today Denies ETOH use Past Medical History  Diagnosis Date  . Diabetes mellitus   . High cholesterol   . Depression   . Bipolar 1 disorder     Past Surgical History  Procedure Date  . Arm surgery   . Knee surgery   . Appendectomy   . Cholecystectomy   . Cesarean section   . Neck surgery     History reviewed. No pertinent family history.  History  Substance Use Topics  . Smoking status: Current Everyday Smoker  . Smokeless tobacco: Not on file  . Alcohol Use: No    OB History    Grav Para Term Preterm Abortions TAB SAB Ect Mult Living                  Review of Systems  Review of Systems  Neurological: Negative for headaches.  All other systems reviewed and are negative.    Allergies  Review of patient's allergies indicates no known allergies.  Home Medications   Current Outpatient Rx  Name Route Sig Dispense Refill  . CITALOPRAM HYDROBROMIDE 40  MG PO TABS Oral Take 40 mg by mouth daily.      . CYCLOBENZAPRINE HCL 10 MG PO TABS Oral Take 10 mg by mouth 3 (three) times daily as needed.      Marland Kitchen LAMOTRIGINE 100 MG PO TABS Oral Take 200 mg by mouth 2 (two) times daily.     Marland Kitchen METFORMIN HCL 500 MG PO TABS Oral Take 500 mg by mouth 2 (two) times daily with a meal.      . METHADONE HCL 10 MG PO TABS Oral Take 50 mg by mouth every 8 (eight) hours.      . OXYCODONE-ACETAMINOPHEN 10-325 MG PO TABS Oral Take 1 tablet by mouth every 4 (four) hours as needed.      Marland Kitchen UNKNOWN TO PATIENT         Physical Exam    BP 146/97  Pulse 101  Temp 98.7 F (37.1 C)  Resp 20  SpO2 96%  LMP 11/11/2010  Physical Exam  CONSTITUTIONAL: Well developed/well nourished HEAD AND FACE: Normocephalic/atraumatic EYES: EOMI/PERRL ENMT: Mucous membranes dry NECK: supple no meningeal signs CV: S1/S2 noted, no murmurs/rubs/gallops noted LUNGS: Lungs are clear to auscultation bilaterally, no apparent distress ABDOMEN: soft, nontender, no rebound or guarding NEURO: Pt is awake/alert, moves all extremitiesx4 EXTREMITIES: pulses normal, full ROM SKIN: warm, color normal PSYCH: no abnormalities of mood noted, awake/alert/appropriate   ED Course  Procedures (including critical  care time)     MDM All labs/vitals reviewed and considered - unremarkable Nursing notes reviewed and considered in documentation  Pt committed by family, she has increased her use of prescription drugs but denies SI, reports only anxiety She is awake/alert, not suicidal, no signs of psychosis D/w husband, troy, on the phone, he confirmed story and he is coming to the ED [phone]) I spoke to patient about need for help with pain meds      Husband arrived I had long discussion with patient/family She still denies SI Husband has removed all prescription meds from house, no access to weapons They are agreeable to f/u as outpatient I informed pt that use of prescription meds  that are not hers is dangerous IVC rescinded I feel she is safe for d/c home and followup Patient/husband agreeable I offered her telepsych, but she would prefer to go home  BP 151/90  Pulse 88  Temp 98.7 F (37.1 C)  Resp 20  SpO2 98%  LMP 11/11/2010     Joya Gaskins, MD 12/22/10 [phone]

## 2010-12-21 NOTE — ED Notes (Signed)
Pt brought in wit IVC papers by [employer]. Papers state that pt has a history of drug use. Family is concerned because pt has been taking her husbands diazepam, flexeril, along with her methadaone and percocet. Family also reports pt has been hallucinating and talking to people who aren't there. Pt has been found naked several times this week on the toilet and on the floor. Pt not sure of how she got undressed. Family and papers report pt is falling asleep while standing up and pt has been dozing off in triage. Pts sister overdosed and died infront of her (pt reports this was accidental). Pt denes telling family that she'd be better off dead and denies any SI/HI at this time.

## 2011-01-09 LAB — BASIC METABOLIC PANEL
BUN: 5 — ABNORMAL LOW
CO2: 31
Chloride: 105
Creatinine, Ser: 0.84
Glucose, Bld: 91
Potassium: 4.3

## 2011-01-09 LAB — HEPATIC FUNCTION PANEL
AST: 15
Bilirubin, Direct: 0.1
Indirect Bilirubin: 0.4
Total Bilirubin: 0.5

## 2011-01-09 LAB — CBC
HCT: 37
MCHC: 34.8
MCV: 92.4
Platelets: 283
RDW: 12.9

## 2011-01-10 LAB — HCG, QUANTITATIVE, PREGNANCY: hCG, Beta Chain, Quant, S: 4336 — ABNORMAL HIGH

## 2011-01-10 LAB — URINALYSIS, ROUTINE W REFLEX MICROSCOPIC
Nitrite: NEGATIVE
Protein, ur: NEGATIVE
Urobilinogen, UA: 0.2

## 2011-01-10 LAB — CBC
Hemoglobin: 13.5
MCHC: 35.3
RBC: 4.1

## 2011-01-10 LAB — URINE MICROSCOPIC-ADD ON

## 2011-01-10 LAB — ABO/RH: ABO/RH(D): O POS

## 2011-01-15 LAB — CBC
HCT: 38.2
Hemoglobin: 13.6
MCHC: 35.6
MCV: 90.7
Platelets: 337
RBC: 4.21
RDW: 13.2
WBC: 8.9

## 2011-01-15 LAB — COMPREHENSIVE METABOLIC PANEL
Albumin: 3.9
Alkaline Phosphatase: 61
BUN: 5 — ABNORMAL LOW
Calcium: 9.5
Creatinine, Ser: 0.81
Glucose, Bld: 116 — ABNORMAL HIGH
Potassium: 3.5
Total Protein: 7

## 2011-01-15 LAB — URINALYSIS, ROUTINE W REFLEX MICROSCOPIC
Bilirubin Urine: NEGATIVE
Ketones, ur: NEGATIVE
Nitrite: NEGATIVE
Protein, ur: NEGATIVE
Specific Gravity, Urine: 1.005
Urobilinogen, UA: 0.2

## 2011-01-15 LAB — DIFFERENTIAL
Basophils Absolute: 0.1
Basophils Relative: 1
Eosinophils Absolute: 0.1
Eosinophils Relative: 1
Lymphocytes Relative: 22
Lymphs Abs: 1.9
Monocytes Absolute: 0.2
Monocytes Relative: 2 — ABNORMAL LOW
Neutro Abs: 6.7
Neutrophils Relative %: 75

## 2011-01-15 LAB — URINE MICROSCOPIC-ADD ON

## 2011-01-15 LAB — PREGNANCY, URINE: Preg Test, Ur: NEGATIVE

## 2011-05-26 ENCOUNTER — Other Ambulatory Visit: Payer: Self-pay | Admitting: Orthopedic Surgery

## 2011-05-26 ENCOUNTER — Ambulatory Visit
Admission: RE | Admit: 2011-05-26 | Discharge: 2011-05-26 | Disposition: A | Discharge status: Home / Self Care | Payer: Medicaid Other | Source: Ambulatory Visit | Attending: Orthopedic Surgery | Admitting: Orthopedic Surgery

## 2011-05-26 DIAGNOSIS — R52 Pain, unspecified: Secondary | ICD-10-CM

## 2011-07-30 ENCOUNTER — Ambulatory Visit: Payer: Medicaid Other | Admitting: Family Medicine

## 2011-08-13 ENCOUNTER — Encounter: Payer: Self-pay | Admitting: Family Medicine

## 2011-08-13 ENCOUNTER — Ambulatory Visit (INDEPENDENT_AMBULATORY_CARE_PROVIDER_SITE_OTHER): Payer: Medicaid Other | Admitting: Family Medicine

## 2011-08-13 VITALS — BP 150/88 | HR 98 | Temp 97.2°F | Ht 65.5 in | Wt 248.3 lb

## 2011-08-13 DIAGNOSIS — F319 Bipolar disorder, unspecified: Secondary | ICD-10-CM

## 2011-08-13 DIAGNOSIS — F419 Anxiety disorder, unspecified: Secondary | ICD-10-CM

## 2011-08-13 DIAGNOSIS — E1169 Type 2 diabetes mellitus with other specified complication: Secondary | ICD-10-CM | POA: Insufficient documentation

## 2011-08-13 DIAGNOSIS — M545 Low back pain, unspecified: Secondary | ICD-10-CM

## 2011-08-13 DIAGNOSIS — E119 Type 2 diabetes mellitus without complications: Secondary | ICD-10-CM

## 2011-08-13 DIAGNOSIS — I10 Essential (primary) hypertension: Secondary | ICD-10-CM

## 2011-08-13 DIAGNOSIS — E669 Obesity, unspecified: Secondary | ICD-10-CM

## 2011-08-13 DIAGNOSIS — M5382 Other specified dorsopathies, cervical region: Secondary | ICD-10-CM

## 2011-08-13 DIAGNOSIS — K589 Irritable bowel syndrome without diarrhea: Secondary | ICD-10-CM

## 2011-08-13 DIAGNOSIS — M489 Spondylopathy, unspecified: Secondary | ICD-10-CM

## 2011-08-13 DIAGNOSIS — F411 Generalized anxiety disorder: Secondary | ICD-10-CM

## 2011-08-13 LAB — CBC
Hemoglobin: 13 g/dL (ref 12.0–15.0)
MCV: 91.4 fL (ref 78.0–100.0)
Platelets: 326 10*3/uL (ref 150–400)
RBC: 4.28 MIL/uL (ref 3.87–5.11)
WBC: 8.6 10*3/uL (ref 4.0–10.5)

## 2011-08-13 LAB — POCT GLYCOSYLATED HEMOGLOBIN (HGB A1C): Hemoglobin A1C: 7.3

## 2011-08-13 LAB — TSH: TSH: 1.469 u[IU]/mL (ref 0.350–4.500)

## 2011-08-13 MED ORDER — GABAPENTIN 300 MG PO CAPS: 300.0000 mg | ORAL_CAPSULE | Freq: Three times a day (TID) | ORAL | Status: DC

## 2011-08-13 MED ORDER — CYCLOBENZAPRINE HCL 10 MG PO TABS: 10.0000 mg | ORAL_TABLET | Freq: Three times a day (TID) | ORAL | Status: DC | PRN

## 2011-08-13 MED ORDER — LISINOPRIL 10 MG PO TABS: 10.0000 mg | ORAL_TABLET | Freq: Every day | ORAL | Status: DC

## 2011-08-13 NOTE — Patient Instructions (Signed)
Good to see another member of a very special family I refilled your flexeril so that you get 60 per month I started you on lisinopril which helps your blood pressure and protects your kidneys. I also started you on gabapentin which is for nerve pain. I will call with blood work results but I want to see you next week to make sure your BP is better and we'll catch up on some of the health maintenance stuff. If you have record of your last tetanus shot, let me know.

## 2011-08-13 NOTE — Assessment & Plan Note (Addendum)
Radiates to Rt. Hip leg, stops at knee. Start gabapentin

## 2011-08-14 LAB — COMPLETE METABOLIC PANEL WITH GFR
ALT: 43 U/L — ABNORMAL HIGH (ref 0–35)
AST: 76 U/L — ABNORMAL HIGH (ref 0–37)
Alkaline Phosphatase: 92 U/L (ref 39–117)
BUN: 8 mg/dL (ref 6–23)
Calcium: 9.7 mg/dL (ref 8.4–10.5)
Chloride: 101 mEq/L (ref 96–112)
Creat: 0.76 mg/dL (ref 0.50–1.10)
Potassium: 5 mEq/L (ref 3.5–5.3)
Sodium: 139 mEq/L (ref 135–145)
Total Bilirubin: 0.4 mg/dL (ref 0.3–1.2)
Total Protein: 7.5 g/dL (ref 6.0–8.3)

## 2011-08-14 MED ORDER — METFORMIN HCL 1000 MG PO TABS: 1000.0000 mg | ORAL_TABLET | Freq: Two times a day (BID) | ORAL | Status: DC

## 2011-08-14 NOTE — Assessment & Plan Note (Signed)
Focus on wt loss to help all her problems

## 2011-08-14 NOTE — Assessment & Plan Note (Signed)
Not at goal, increase metformen

## 2011-08-14 NOTE — Assessment & Plan Note (Signed)
Decent control 

## 2011-08-14 NOTE — Progress Notes (Signed)
  Subjective:    Patient ID: [name], female    DOB: 02-01-1966, 46 y.o.   MRN: [id number]  HPI New patient, multiple problems Bipolar, Control OK on current meds. Chronic low back pain with radiation.  Waiting referral to pain clinic.  Not on any neuropathic pain meds Diabetes, on low dose metformen Has been running high blood pressures recently    Review of Systems     Objective:   Physical Exam High BP confirmed Neck supple without sig nodes Lungs clear Cardiac RRR without m Abd benign       Assessment & Plan:

## 2011-08-14 NOTE — Assessment & Plan Note (Signed)
Start ACE since DM

## 2011-08-15 ENCOUNTER — Encounter: Payer: Self-pay | Admitting: Family Medicine

## 2011-08-15 NOTE — Progress Notes (Signed)
Patient ID: [name], female   DOB: [birth year], 46 y.o.   MRN: [id number] Reviewed old records.  Patient has had elevated transaminases and elevated chol.  Will need d LDL now that she is on atorvastatin.  Unfortunately, no documentation of last pap, last eye exam or last tetanus.

## 2011-08-20 ENCOUNTER — Ambulatory Visit (INDEPENDENT_AMBULATORY_CARE_PROVIDER_SITE_OTHER): Payer: Medicaid Other | Admitting: Family Medicine

## 2011-08-20 ENCOUNTER — Encounter: Payer: Self-pay | Admitting: Family Medicine

## 2011-08-20 VITALS — BP 135/80 | Temp 97.8°F | Ht 65.5 in | Wt 248.0 lb

## 2011-08-20 DIAGNOSIS — R7401 Elevation of levels of liver transaminase levels: Secondary | ICD-10-CM

## 2011-08-20 DIAGNOSIS — Z720 Tobacco use: Secondary | ICD-10-CM

## 2011-08-20 DIAGNOSIS — Z23 Encounter for immunization: Secondary | ICD-10-CM

## 2011-08-20 DIAGNOSIS — I10 Essential (primary) hypertension: Secondary | ICD-10-CM

## 2011-08-20 DIAGNOSIS — F17200 Nicotine dependence, unspecified, uncomplicated: Secondary | ICD-10-CM

## 2011-08-20 DIAGNOSIS — E669 Obesity, unspecified: Secondary | ICD-10-CM

## 2011-08-20 LAB — COMPLETE METABOLIC PANEL WITH GFR
ALT: 38 U/L — ABNORMAL HIGH (ref 0–35)
Albumin: 4.7 g/dL (ref 3.5–5.2)
CO2: 27 mEq/L (ref 19–32)
GFR, Est African American: >89 mL/min
Potassium: 4.5 mEq/L (ref 3.5–5.3)
Sodium: 139 mEq/L (ref 135–145)
Total Bilirubin: 0.3 mg/dL (ref 0.3–1.2)
Total Protein: 7.2 g/dL (ref 6.0–8.3)

## 2011-08-20 MED ORDER — VARENICLINE TARTRATE 0.5 MG X 11 & 1 MG X 42 PO MISC: ORAL | Status: DC

## 2011-08-20 NOTE — Patient Instructions (Signed)
Get your eyes checked and be sure to tell them you are a diabetic.  Let me know the results. BP is great.  Stay on the same meds I will call you with lab results. The single biggest thing you could do for your health is to quit smoking.  See me in three months.

## 2011-08-20 NOTE — Progress Notes (Signed)
  Subjective:    Patient ID: [name], female    DOB: [birth year], 46 y.o.   MRN: [id number]  HPI Diabetes not perfect with A1C =7.3.  In response, increased metformen to 1000 mg bid.  Also, she is working hard on diet.   HBP improved.  Tolerating meds well Mood OK. Waiting to be seen by pain management for chronic low back pain which impairs her ability to exercise. Transaminase elevation noted on initial blood work.  Discussed: no hx of hepatitis but does have some risk factors.  Old records reveled previous elevation and CT documented fatty liver.    Review of Systems     Objective:   Physical Exam Lungsclear Cards RRR without m ABd benign, no hepatomegally       Assessment & Plan:

## 2011-08-21 LAB — HEPATITIS B CORE ANTIBODY, TOTAL: Hep B Core Total Ab: NEGATIVE

## 2011-08-21 NOTE — Assessment & Plan Note (Signed)
Recheck LFTs and hepatitis antibodies

## 2011-08-21 NOTE — Assessment & Plan Note (Signed)
Motivated to quit.  Discussed and prescribed Chantix

## 2011-08-21 NOTE — Assessment & Plan Note (Signed)
Central to much of her med problems.  Focus on diet for now.  Focus also on exercise post pain management

## 2011-08-21 NOTE — Assessment & Plan Note (Signed)
Much improved on meds.  Recheck renal function on ACE

## 2011-08-22 ENCOUNTER — Encounter: Payer: Self-pay | Admitting: Family Medicine

## 2011-09-05 ENCOUNTER — Telehealth: Payer: Self-pay | Admitting: Family Medicine

## 2011-09-05 NOTE — Telephone Encounter (Signed)
Spoke with patient and she stated that she has had another "episode" of which she has talked to Dr. Leveda Anna about before. She was out of town from [place]) and had this "episode" Monday. She is not dx with seizures, but is on Lamictal for Bi-polar disorder and was wanting to know that if this is seizures why would this medicine not help or stop these. She has looked on the Internet and is now concerned that she may have a brain tumor. Would like to know if Dr. Leveda Anna would like her to come back sooner that 3 month. She states that she is taking all medicine she should be on and like they are prescribed.

## 2011-09-05 NOTE — Telephone Encounter (Signed)
Called.  Had a spell of Rt arm twitching during significant photic stimulation (police lights).  No loss of conciousness or confusion.  On lamictal 100 mg daily for bipolar.    Will observe for now.  Consider EEG and or increase lamictal to 200 mg daily if further spells.  No CNS imaging unless progressive headache.

## 2011-09-05 NOTE — Telephone Encounter (Signed)
Patient is calling because she wants to speak to Dr. Leveda Anna about something they had discussed previously.  She really wants to speak with Dr. Leveda Anna and not the nurse.

## 2011-10-09 ENCOUNTER — Other Ambulatory Visit: Payer: Self-pay | Admitting: Family Medicine

## 2011-10-09 MED ORDER — VARENICLINE TARTRATE 1 MG PO TABS: 1.0000 mg | ORAL_TABLET | Freq: Two times a day (BID) | ORAL | Status: DC

## 2011-10-27 ENCOUNTER — Other Ambulatory Visit: Payer: Self-pay | Admitting: Family Medicine

## 2011-10-27 DIAGNOSIS — E669 Obesity, unspecified: Secondary | ICD-10-CM

## 2011-10-27 NOTE — Telephone Encounter (Signed)
Patient is calling because she needs an Rx for her IBS, something for her nail fungus and test strips for Accu check [facility]iva.

## 2011-10-28 MED ORDER — GLUCOSE BLOOD VI STRP: ORAL_STRIP | Status: DC

## 2011-10-28 NOTE — Telephone Encounter (Signed)
Tried calling number which is now disconnected.  Sent Rx for test strips.  Needs appointment for other problems.

## 2011-10-28 NOTE — Assessment & Plan Note (Signed)
Will send test strips

## 2011-10-30 ENCOUNTER — Other Ambulatory Visit: Payer: Self-pay | Admitting: *Deleted

## 2011-10-30 DIAGNOSIS — M545 Low back pain: Secondary | ICD-10-CM

## 2011-10-30 MED ORDER — GABAPENTIN 300 MG PO CAPS: 300.0000 mg | ORAL_CAPSULE | Freq: Three times a day (TID) | ORAL | Status: DC

## 2011-11-26 ENCOUNTER — Ambulatory Visit: Payer: Medicaid Other | Admitting: Family Medicine

## 2011-12-02 ENCOUNTER — Other Ambulatory Visit: Payer: Self-pay | Admitting: *Deleted

## 2011-12-02 MED ORDER — VARENICLINE TARTRATE 1 MG PO TABS: 1.0000 mg | ORAL_TABLET | Freq: Two times a day (BID) | ORAL | Status: AC

## 2011-12-03 ENCOUNTER — Ambulatory Visit (INDEPENDENT_AMBULATORY_CARE_PROVIDER_SITE_OTHER): Payer: Medicaid Other | Admitting: Family Medicine

## 2011-12-03 DIAGNOSIS — I10 Essential (primary) hypertension: Secondary | ICD-10-CM

## 2011-12-03 DIAGNOSIS — E1169 Type 2 diabetes mellitus with other specified complication: Secondary | ICD-10-CM

## 2011-12-03 DIAGNOSIS — E669 Obesity, unspecified: Secondary | ICD-10-CM

## 2011-12-03 DIAGNOSIS — M545 Low back pain: Secondary | ICD-10-CM

## 2011-12-03 DIAGNOSIS — F411 Generalized anxiety disorder: Secondary | ICD-10-CM

## 2011-12-03 DIAGNOSIS — E119 Type 2 diabetes mellitus without complications: Secondary | ICD-10-CM

## 2011-12-03 DIAGNOSIS — F419 Anxiety disorder, unspecified: Secondary | ICD-10-CM

## 2011-12-03 MED ORDER — PROPRANOLOL HCL ER 60 MG PO CP24: 60.0000 mg | ORAL_CAPSULE | Freq: Every day | ORAL | Status: DC

## 2011-12-03 MED ORDER — CYCLOBENZAPRINE HCL 10 MG PO TABS: 10.0000 mg | ORAL_TABLET | Freq: Three times a day (TID) | ORAL | Status: DC | PRN

## 2011-12-03 MED ORDER — LORATADINE 10 MG PO TABS: 10.0000 mg | ORAL_TABLET | Freq: Every day | ORAL | Status: DC

## 2011-12-03 MED ORDER — METFORMIN HCL 1000 MG PO TABS: 1000.0000 mg | ORAL_TABLET | Freq: Two times a day (BID) | ORAL | Status: DC

## 2011-12-03 NOTE — Patient Instructions (Addendum)
The maximum amount of tylenol=acetomenophen you should take per day is 3,000 mg so no more than eight 325 mg tabs per day. Your weight loss of 6 lbs is great.  Keep up the good work Your blood pressure is where we need it Your diabetes is within a hair of where we need it (goal A1C is 7.0 or less - yours was 7.1) Don't forget the eye exam Try the propranolol which should help the anxiety

## 2011-12-05 ENCOUNTER — Encounter: Payer: Self-pay | Admitting: Family Medicine

## 2011-12-05 NOTE — Assessment & Plan Note (Signed)
Per request, increase flexeril so she can take TID.

## 2011-12-05 NOTE — Assessment & Plan Note (Signed)
C/o anxiety.  Reluctant to use benzo.  Try propranolol.  Next step would be Buspar.

## 2011-12-05 NOTE — Progress Notes (Signed)
  Subjective:    Patient ID: [name], female    DOB: [birth year], 46 y.o.   MRN: [id number]  HPI Doing well but several concerns. Sees pain management and gets percocet 10/325.  Asked about safe tylenol dose. Presumed bug bites after sleeping at a friends house.  Resolving and no new lesions.   BP good. DM eating healthier, A1C nice downward trend.  No sig wt loss yet.    Review of Systems     Objective:   Physical Exam Skin - does look like bug bites, none infected. Lungs clear Cardiac RRR without m or g        Assessment & Plan:

## 2011-12-05 NOTE — Assessment & Plan Note (Signed)
Good control

## 2011-12-23 ENCOUNTER — Telehealth: Payer: Self-pay | Admitting: Family Medicine

## 2011-12-23 NOTE — Telephone Encounter (Signed)
Patient is calling because she thinks that one of her medications is causing severe nausea.  She just started Propanolol within the week and doesn't know if that could be causing it.

## 2011-12-24 NOTE — Telephone Encounter (Signed)
Forwarded to pcp for advice.[name]  

## 2011-12-24 NOTE — Telephone Encounter (Signed)
Attempted to call patient.  Number is disconnected.  If she calls back, let her know that it would be very unusual for propranolol to cause nausea

## 2011-12-25 NOTE — Telephone Encounter (Signed)
Called and discussed.  Will stop x 1 week then restart propranolol to see if causing nausea.  Has chronic stomach problems so I doubt propranolol is the culprit. She knows she will need an appointment to further evaluate if nausea persists.  Denies possibility of pregnancy.

## 2011-12-25 NOTE — Telephone Encounter (Signed)
Called and spoke to pt and told her that Dr. Leveda Anna felt that this would be unusual for this med to cause nausea. she stated that this is the only change that she has had to her meds. I told her that I would forward this information back to him. Pt voiced understanding.[name] [place]

## 2012-01-23 ENCOUNTER — Other Ambulatory Visit: Payer: Self-pay | Admitting: Family Medicine

## 2012-01-23 DIAGNOSIS — F419 Anxiety disorder, unspecified: Secondary | ICD-10-CM

## 2012-01-23 MED ORDER — ZOLPIDEM TARTRATE 10 MG PO TABS: 10.0000 mg | ORAL_TABLET | Freq: Every evening | ORAL | Status: DC | PRN

## 2012-01-23 NOTE — Assessment & Plan Note (Signed)
Refilled ambien based on fax request.

## 2012-03-01 ENCOUNTER — Other Ambulatory Visit: Payer: Self-pay | Admitting: *Deleted

## 2012-03-01 MED ORDER — LAMOTRIGINE 100 MG PO TABS: 100.0000 mg | ORAL_TABLET | Freq: Every day | ORAL | Status: DC

## 2012-04-30 ENCOUNTER — Telehealth: Payer: Self-pay | Admitting: *Deleted

## 2012-04-30 ENCOUNTER — Encounter: Payer: Self-pay | Admitting: Family Medicine

## 2012-04-30 ENCOUNTER — Ambulatory Visit (INDEPENDENT_AMBULATORY_CARE_PROVIDER_SITE_OTHER): Payer: Medicaid Other | Admitting: Family Medicine

## 2012-04-30 VITALS — BP 134/75 | HR 105 | Ht 65.5 in | Wt 261.8 lb

## 2012-04-30 DIAGNOSIS — E669 Obesity, unspecified: Secondary | ICD-10-CM

## 2012-04-30 DIAGNOSIS — F319 Bipolar disorder, unspecified: Secondary | ICD-10-CM

## 2012-04-30 DIAGNOSIS — I10 Essential (primary) hypertension: Secondary | ICD-10-CM

## 2012-04-30 DIAGNOSIS — E119 Type 2 diabetes mellitus without complications: Secondary | ICD-10-CM

## 2012-04-30 DIAGNOSIS — Z23 Encounter for immunization: Secondary | ICD-10-CM

## 2012-04-30 DIAGNOSIS — M545 Low back pain: Secondary | ICD-10-CM

## 2012-04-30 LAB — POCT GLYCOSYLATED HEMOGLOBIN (HGB A1C): Hemoglobin A1C: 7

## 2012-04-30 MED ORDER — METFORMIN HCL 1000 MG PO TABS: 1000.0000 mg | ORAL_TABLET | Freq: Two times a day (BID) | ORAL | Status: DC

## 2012-04-30 MED ORDER — QUETIAPINE FUMARATE 50 MG PO TABS: 50.0000 mg | ORAL_TABLET | Freq: Every day | ORAL | Status: DC

## 2012-04-30 MED ORDER — CITALOPRAM HYDROBROMIDE 40 MG PO TABS: 40.0000 mg | ORAL_TABLET | Freq: Every day | ORAL | Status: DC

## 2012-04-30 MED ORDER — ATORVASTATIN CALCIUM 20 MG PO TABS: 20.0000 mg | ORAL_TABLET | Freq: Every day | ORAL | Status: DC

## 2012-04-30 MED ORDER — GABAPENTIN 300 MG PO CAPS: 300.0000 mg | ORAL_CAPSULE | Freq: Three times a day (TID) | ORAL | Status: DC

## 2012-04-30 MED ORDER — LAMOTRIGINE 100 MG PO TABS: 100.0000 mg | ORAL_TABLET | Freq: Every day | ORAL | Status: DC

## 2012-04-30 MED ORDER — LISINOPRIL 10 MG PO TABS: 10.0000 mg | ORAL_TABLET | Freq: Every day | ORAL | Status: DC

## 2012-04-30 MED ORDER — CYCLOBENZAPRINE HCL 10 MG PO TABS: 10.0000 mg | ORAL_TABLET | Freq: Three times a day (TID) | ORAL | Status: DC | PRN

## 2012-04-30 NOTE — Telephone Encounter (Signed)
Done

## 2012-04-30 NOTE — Assessment & Plan Note (Signed)
Consider bariatric surg but focus now on wt.

## 2012-04-30 NOTE — Progress Notes (Signed)
  Subjective:    Patient ID: [name], female    DOB: 1965-10-12, 47 y.o.   MRN: [id number]  HPI Emotions bad with both depression and anxiety "feels crazy"  With no obvious cause.  Not in counseling.  No SI or HI but feels quite bad. Also concerned about stress incontinence, Elbow pain Toenail fungus Wt - wants to know more about bariatric surg.    Review of Systems     Objective:   Physical Exam Affect OK, does get tearful at time Onycomycosis of great toenail       Assessment & Plan:

## 2012-04-30 NOTE — Assessment & Plan Note (Signed)
Significantly worse.  Add seroquel.  She has used before and quite sedating, so will do low dose only.

## 2012-04-30 NOTE — Patient Instructions (Signed)
Stop the ambien/zolpidem Start the seroquel may build to two tabs per day.  Focus today is on depression See me in one month and we will talk about wt loss surg if your emotions are under better control. You are a good candidate for bariatric surgery because the cows are still in the barn.

## 2012-04-30 NOTE — Assessment & Plan Note (Signed)
OK control but concerned about wt gain.

## 2012-04-30 NOTE — Telephone Encounter (Signed)
Patient came back in and request that Dr. Leveda [name] re-write all new prscriptionsto get them in synch as you had discussed at the OV today.

## 2012-05-28 ENCOUNTER — Ambulatory Visit (INDEPENDENT_AMBULATORY_CARE_PROVIDER_SITE_OTHER): Payer: Medicaid Other | Admitting: Family Medicine

## 2012-05-28 ENCOUNTER — Encounter: Payer: Self-pay | Admitting: Family Medicine

## 2012-05-28 VITALS — BP 156/84 | HR 100 | Temp 97.8°F | Ht 65.5 in | Wt 258.7 lb

## 2012-05-28 DIAGNOSIS — M545 Low back pain: Secondary | ICD-10-CM

## 2012-05-28 LAB — LIPID PANEL
HDL: 38 mg/dL — ABNORMAL LOW (ref >39)
LDL Cholesterol: 56 mg/dL (ref 0–99)

## 2012-05-28 LAB — COMPLETE METABOLIC PANEL WITH GFR
ALT: 45 U/L — ABNORMAL HIGH (ref 0–35)
Albumin: 4.4 g/dL (ref 3.5–5.2)
CO2: 26 mEq/L (ref 19–32)
Calcium: 9.2 mg/dL (ref 8.4–10.5)
Chloride: 103 mEq/L (ref 96–112)
GFR, Est African American: >89 mL/min
GFR, Est Non African American: >89 mL/min
Glucose, Bld: 126 mg/dL — ABNORMAL HIGH (ref 70–99)
Potassium: 4.5 mEq/L (ref 3.5–5.3)
Sodium: 140 mEq/L (ref 135–145)
Total Protein: 7 g/dL (ref 6.0–8.3)

## 2012-05-28 MED ORDER — CYCLOBENZAPRINE HCL 10 MG PO TABS: 10.0000 mg | ORAL_TABLET | Freq: Every evening | ORAL | Status: DC | PRN

## 2012-05-28 NOTE — Patient Instructions (Addendum)
Stop the seroquel. Stop the lamictal. You can only take the flexeril at night and you must go to be no more than 30 minutes after you have taken the flexeril Go to bed no later than 11 o'clock each night.  No excuses. Take you celexa in the morning, rather than at night.   Continue taking the gabapentin as is.   See me in 2-3 weeks.  I want to see how you do off some medications. At that point I will likely restart a new medicine - probably lithium  I will consider diazepam - but it is a drug that has addiction potential.

## 2012-05-28 NOTE — Progress Notes (Signed)
  Subjective:    Patient ID: [name], female    DOB: [birth year], 47 y.o.   MRN: [id number]  HPI Comes in with husband.  Not thriving.  Depressed (no SI/HI).  Not sleeping at night.  Somnulent during the day.  Chronic pain unchanged.  Reviewed med hx including timing, initiation and duration of current meds.       Review of Systems     Objective:   Physical Exam Affect teary at times but I did get a few laughs.  Husband appropriately concerned.       Assessment & Plan:

## 2012-06-16 ENCOUNTER — Ambulatory Visit: Payer: Medicaid Other | Admitting: Family Medicine

## 2012-06-18 IMAGING — CR DG CERVICAL SPINE 2 OR 3 VIEWS
3 series · 3 of 3 positions shown · non-contrast
Comparison: Intraoperative C-arm spot films of 09/11/2010

CLINICAL DATA: Follow up of anterior fusion inch unit 4984

CERVICAL SPINE - 2-3 VIEW

[view not recorded (1 of 3)]
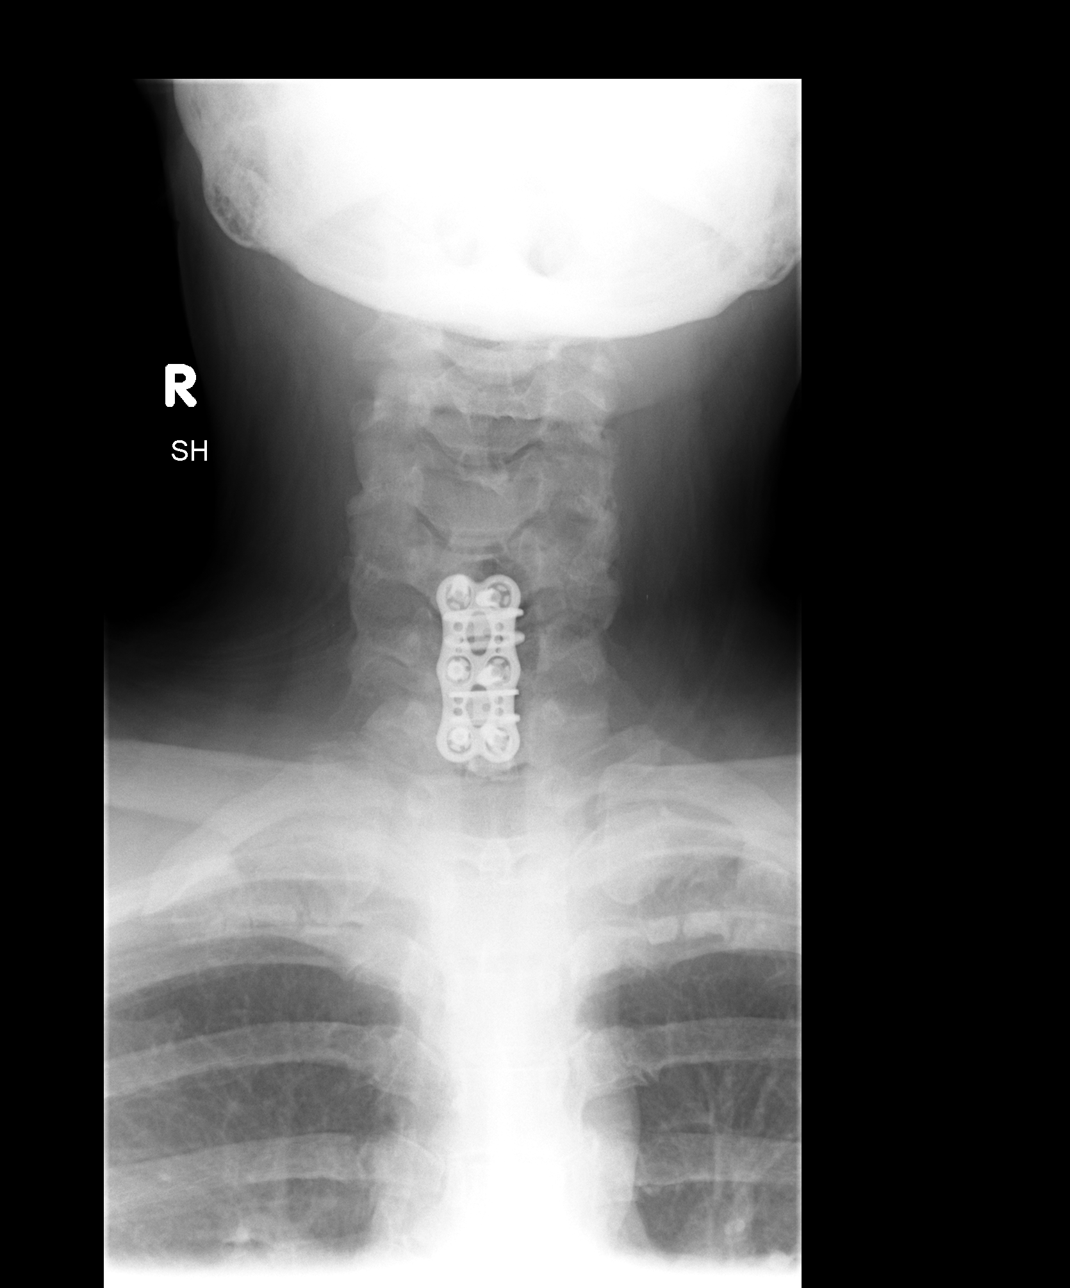

[view not recorded (2 of 3)]
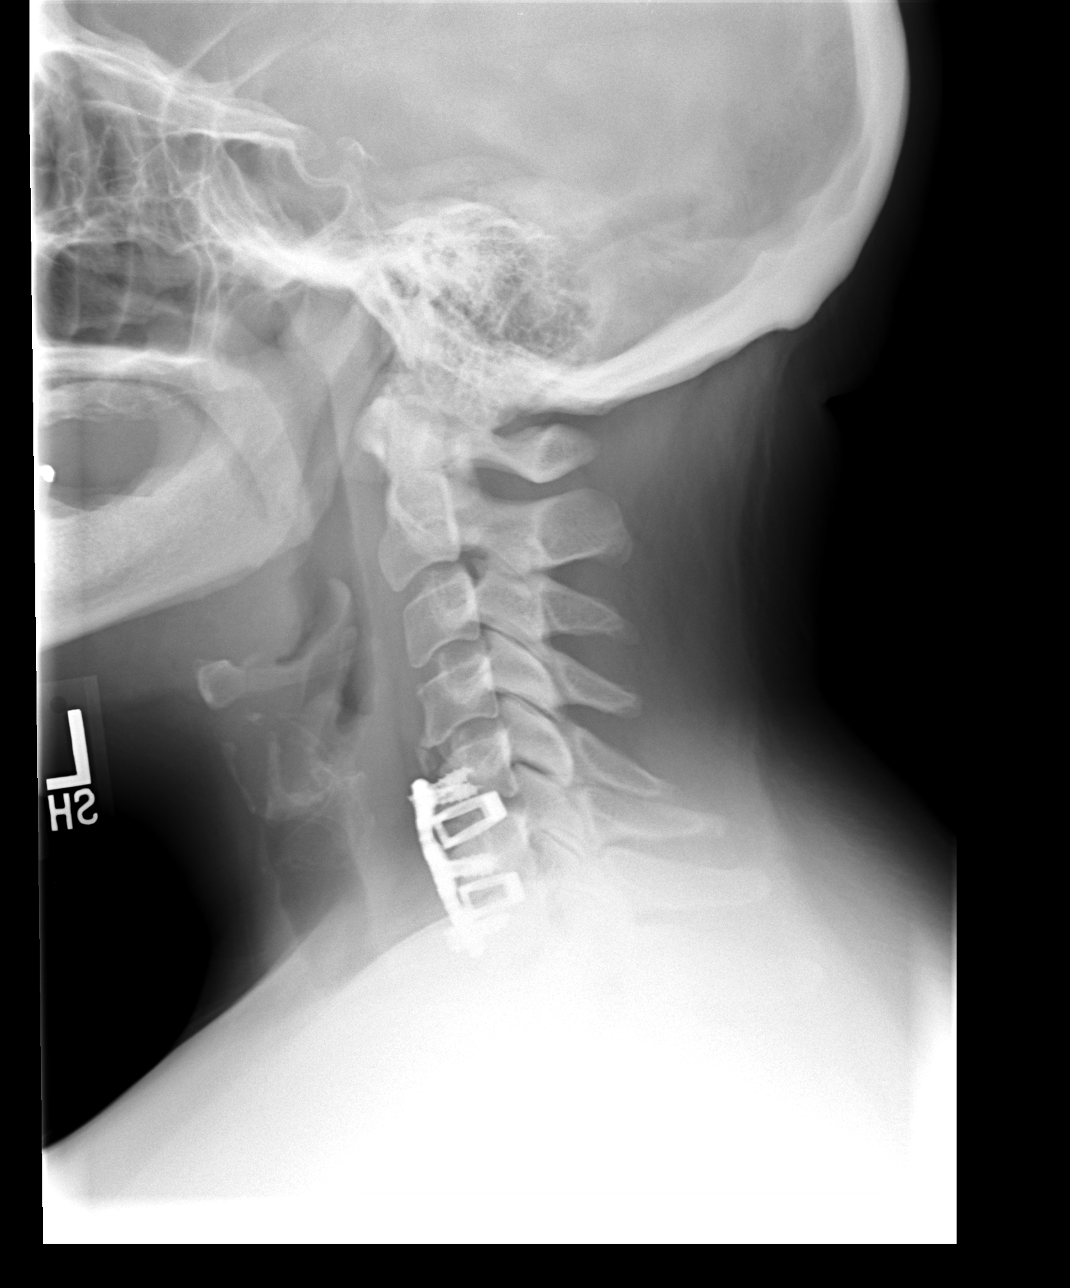

[view not recorded (3 of 3)]
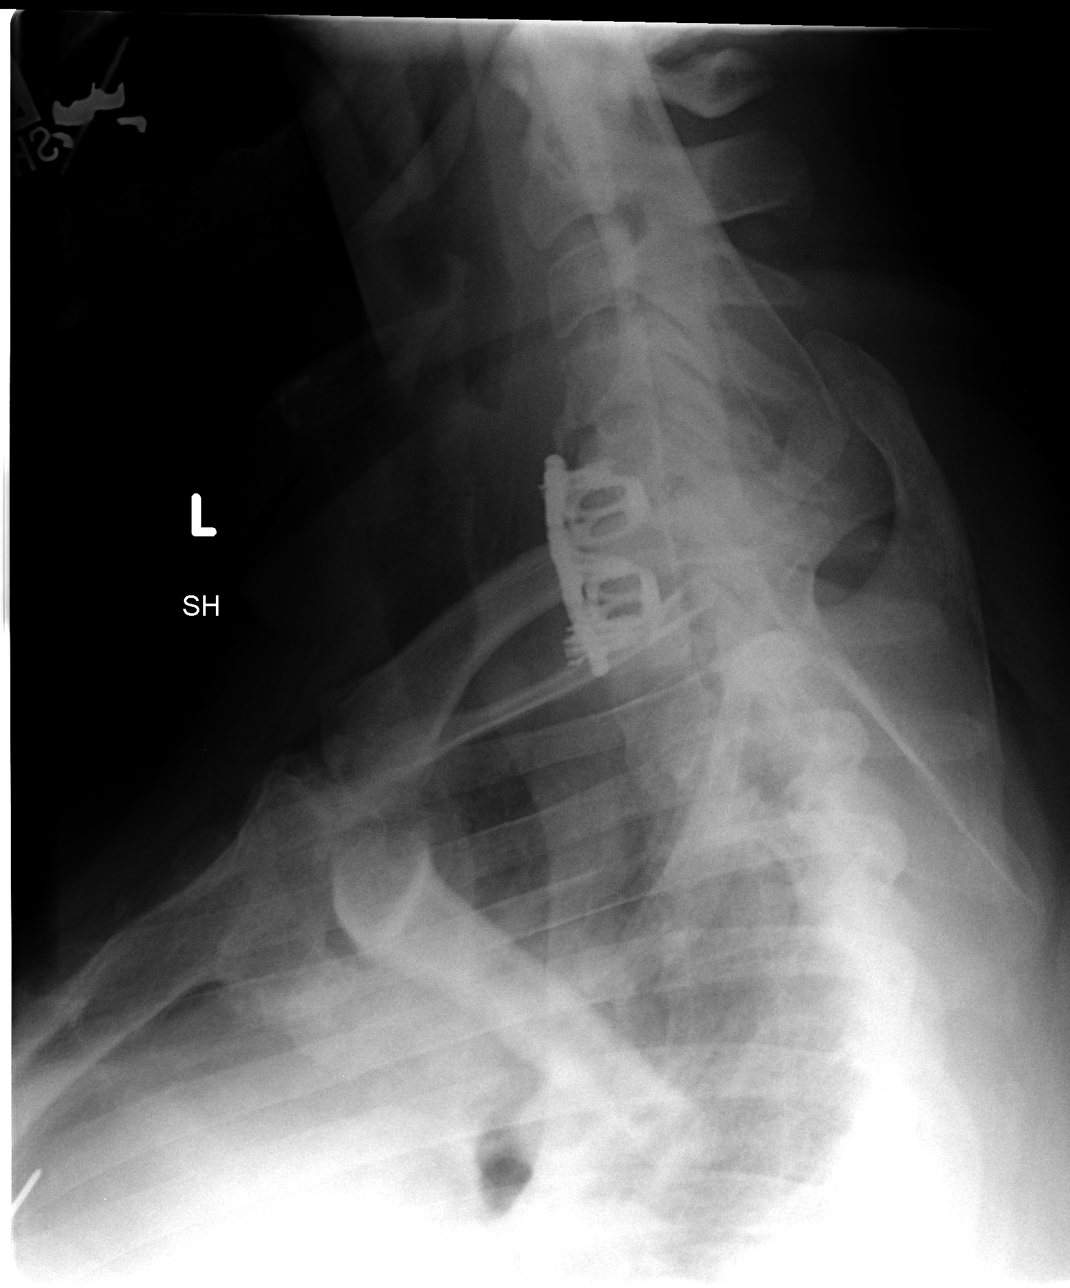

[3 of 3 positions shown; findings below may reference images not displayed]

FINDINGS: Anterior fusion from C5-C7 appears stable.  The anterior
metallic fixation plate is unchanged in position, as are the
prosthetic intervertebral discs at C5-6 and C6-7 levels.  No
prevertebral soft tissue swelling is seen.  The lung apices are
clear.
IMPRESSION: Stable anterior fusion from C5-C7 with somewhat straightened
alignment.

## 2012-06-23 ENCOUNTER — Encounter: Payer: Self-pay | Admitting: Family Medicine

## 2012-06-23 ENCOUNTER — Ambulatory Visit (INDEPENDENT_AMBULATORY_CARE_PROVIDER_SITE_OTHER): Payer: Medicaid Other | Admitting: Family Medicine

## 2012-06-23 VITALS — BP 139/70 | HR 120 | Temp 98.9°F | Ht 65.5 in | Wt 251.0 lb

## 2012-06-23 DIAGNOSIS — F411 Generalized anxiety disorder: Secondary | ICD-10-CM

## 2012-06-23 DIAGNOSIS — M545 Low back pain, unspecified: Secondary | ICD-10-CM

## 2012-06-23 DIAGNOSIS — F419 Anxiety disorder, unspecified: Secondary | ICD-10-CM

## 2012-06-23 DIAGNOSIS — E669 Obesity, unspecified: Secondary | ICD-10-CM

## 2012-06-23 MED ORDER — DIAZEPAM 5 MG PO TABS: 5.0000 mg | ORAL_TABLET | Freq: Two times a day (BID) | ORAL | Status: DC | PRN

## 2012-06-23 NOTE — Assessment & Plan Note (Signed)
Add diazepam.  Very clear about limits and I will monitor closely for abuse.  No dose escalation.

## 2012-06-23 NOTE — Assessment & Plan Note (Signed)
Good medical wt loss.  Continue and refer to bariatric surg

## 2012-06-23 NOTE — Assessment & Plan Note (Signed)
Told to decrease flexeril to qhs.  The diazepam will help with muscle spasms.

## 2012-06-23 NOTE — Patient Instructions (Addendum)
See me in one month Good job on the weight loss. We may be able to get off the flexeril.  Diazepam is a muscle relaxant in addition to the anti anxiety medicine.

## 2012-06-23 NOTE — Progress Notes (Signed)
  Subjective:    Patient ID: [name], female    DOB: Jul 15, 1965, 47 y.o.   MRN: [id number]  HPI  Significantly improved since last visit.  Less is more.  She is less sedated off multiple meds.  She is not yet well.  States her anxiety is disabling - can't go to grocery.    Also note wt loss.  She is more active and eating healthier.  Would like referral to bariatric surg.  OK now that she is under better emotional control.      Review of Systems     Objective:   Physical Exam Affect good.  More alert.  Improvement verified by husband who accompanies her.       Assessment & Plan:

## 2012-07-21 ENCOUNTER — Ambulatory Visit: Payer: Medicaid Other | Admitting: Family Medicine

## 2012-08-19 ENCOUNTER — Other Ambulatory Visit: Payer: Self-pay | Admitting: Family Medicine

## 2012-08-19 DIAGNOSIS — F419 Anxiety disorder, unspecified: Secondary | ICD-10-CM

## 2012-08-19 MED ORDER — DIAZEPAM 5 MG PO TABS: 5.0000 mg | ORAL_TABLET | Freq: Two times a day (BID) | ORAL | Status: DC | PRN

## 2012-08-27 ENCOUNTER — Encounter (HOSPITAL_COMMUNITY): Payer: Self-pay

## 2012-08-27 ENCOUNTER — Inpatient Hospital Stay (HOSPITAL_COMMUNITY)
Admission: EM | Admit: 2012-08-27 | Discharge: 2012-08-31 | DRG: 918 | Disposition: A | Discharge status: Other Acute Inpatient Hospital | Payer: Medicaid Other | Attending: Family Medicine | Admitting: Family Medicine

## 2012-08-27 DIAGNOSIS — F14120 Cocaine abuse with intoxication, uncomplicated: Secondary | ICD-10-CM

## 2012-08-27 DIAGNOSIS — F329 Major depressive disorder, single episode, unspecified: Secondary | ICD-10-CM

## 2012-08-27 DIAGNOSIS — M545 Low back pain, unspecified: Secondary | ICD-10-CM | POA: Diagnosis present

## 2012-08-27 DIAGNOSIS — F319 Bipolar disorder, unspecified: Secondary | ICD-10-CM

## 2012-08-27 DIAGNOSIS — T424X4A Poisoning by benzodiazepines, undetermined, initial encounter: Principal | ICD-10-CM | POA: Diagnosis present

## 2012-08-27 DIAGNOSIS — Z720 Tobacco use: Secondary | ICD-10-CM

## 2012-08-27 DIAGNOSIS — Z6841 Body Mass Index (BMI) 40.0 and over, adult: Secondary | ICD-10-CM

## 2012-08-27 DIAGNOSIS — T43502A Poisoning by unspecified antipsychotics and neuroleptics, intentional self-harm, initial encounter: Secondary | ICD-10-CM | POA: Diagnosis present

## 2012-08-27 DIAGNOSIS — E669 Obesity, unspecified: Secondary | ICD-10-CM | POA: Diagnosis present

## 2012-08-27 DIAGNOSIS — G8929 Other chronic pain: Secondary | ICD-10-CM | POA: Diagnosis present

## 2012-08-27 DIAGNOSIS — F419 Anxiety disorder, unspecified: Secondary | ICD-10-CM

## 2012-08-27 DIAGNOSIS — R4182 Altered mental status, unspecified: Secondary | ICD-10-CM

## 2012-08-27 DIAGNOSIS — Z981 Arthrodesis status: Secondary | ICD-10-CM

## 2012-08-27 DIAGNOSIS — F17200 Nicotine dependence, unspecified, uncomplicated: Secondary | ICD-10-CM | POA: Diagnosis present

## 2012-08-27 DIAGNOSIS — E119 Type 2 diabetes mellitus without complications: Secondary | ICD-10-CM | POA: Diagnosis present

## 2012-08-27 DIAGNOSIS — N390 Urinary tract infection, site not specified: Secondary | ICD-10-CM | POA: Diagnosis present

## 2012-08-27 DIAGNOSIS — R45851 Suicidal ideations: Secondary | ICD-10-CM

## 2012-08-27 DIAGNOSIS — E7800 Pure hypercholesterolemia, unspecified: Secondary | ICD-10-CM | POA: Diagnosis present

## 2012-08-27 DIAGNOSIS — T6592XA Toxic effect of unspecified substance, intentional self-harm, initial encounter: Secondary | ICD-10-CM

## 2012-08-27 DIAGNOSIS — E1169 Type 2 diabetes mellitus with other specified complication: Secondary | ICD-10-CM | POA: Diagnosis present

## 2012-08-27 DIAGNOSIS — I10 Essential (primary) hypertension: Secondary | ICD-10-CM | POA: Diagnosis present

## 2012-08-27 DIAGNOSIS — F411 Generalized anxiety disorder: Secondary | ICD-10-CM | POA: Diagnosis present

## 2012-08-27 DIAGNOSIS — K589 Irritable bowel syndrome without diarrhea: Secondary | ICD-10-CM | POA: Diagnosis present

## 2012-08-27 DIAGNOSIS — F1410 Cocaine abuse, uncomplicated: Secondary | ICD-10-CM | POA: Diagnosis present

## 2012-08-27 DIAGNOSIS — F3130 Bipolar disorder, current episode depressed, mild or moderate severity, unspecified: Secondary | ICD-10-CM | POA: Diagnosis present

## 2012-08-27 NOTE — ED Provider Notes (Signed)
History     CSN: [id number]  Arrival date & time 08/27/12  2332   First MD Initiated Contact with Patient 08/27/12 2339      Chief Complaint  Patient presents with  . Drug Overdose   Level 5 caveat due to altered mental status.  (Consider location/radiation/quality/duration/timing/severity/associated sxs/prior treatment) Patient is a 47 y.o. female presenting with Overdose. The history is provided by the patient and the EMS personnel.  Drug Overdose   patient was brought in by EMS for altered mental status. He was found in her [employer] outside and was unresponsive. Initial respiratory was 4. She's she sounds a little bit of pain. She improved somewhat with 3 mg of Narcan by EMS. No known trauma. Patient has history of depression. Husband is reportedly taking an IVC papers on her. The [employer] and patient's pill bottles were reviewed. She had 60 pills of diazepam filled on the 23rd. There are 27 left. She also had 90 pills of 15 mg oxycodone filled on the 21st. She does not have the bottle for this.  Past Medical History  Diagnosis Date  . Diabetes mellitus   . High cholesterol   . Depression   . Bipolar 1 disorder     Past Surgical History  Procedure Laterality Date  . Arm surgery    . Knee surgery    . Appendectomy    . Cholecystectomy    . Cesarean section    . Neck surgery      History reviewed. No pertinent family history.  History  Substance Use Topics  . Smoking status: Current Every Day Smoker -- 1.00 packs/day    Types: Cigarettes  . Smokeless tobacco: Not on file  . Alcohol Use: No    OB History   Grav Para Term Preterm Abortions TAB SAB Ect Mult Living                  Review of Systems  Unable to perform ROS: Mental status change    Allergies  Methadone hcl; Morphine and related; and Propranolol  Home Medications   No current outpatient prescriptions on file.  BP 111/73  Pulse 89  Temp(Src) 98 F (36.7 C) (Oral)  Resp 15  Ht  5\' 5"  (1.651 m)  Wt 234 lb 9.1 oz (106.4 kg)  BMI 39.03 kg/m2  SpO2 92%  Physical Exam  Constitutional: She appears well-developed and well-nourished.  HENT:  Head: Normocephalic.  Eyes:  Pupils are mildly constricted.  Neck: Neck supple.  Cardiovascular: Normal rate and regular rhythm.   Pulmonary/Chest: Effort normal and breath sounds normal.  Mild snoring respirations  Abdominal: Soft. There is no tenderness.  Musculoskeletal: She exhibits no edema.  Neurological:  Patient will move and withdraw to pain. Nonverbal. She'll not follow commands or open eyes to her name.  Skin: Skin is warm. No rash noted. No erythema.    ED Course  Procedures (including critical care time)  Labs Reviewed  COMPREHENSIVE METABOLIC PANEL - Abnormal; Notable for the following:    Glucose, Bld 157 (*)    Total Bilirubin 0.2 (*)    All other components within normal limits  URINE RAPID DRUG SCREEN (HOSP PERFORMED) - Abnormal; Notable for the following:    Cocaine POSITIVE (*)    Benzodiazepines POSITIVE (*)    All other components within normal limits  URINALYSIS, ROUTINE W REFLEX MICROSCOPIC - Abnormal; Notable for the following:    APPearance CLOUDY (*)    Glucose, UA 100 (*)  Hgb urine dipstick TRACE (*)    Nitrite POSITIVE (*)    Leukocytes, UA LARGE (*)    All other components within normal limits  SALICYLATE LEVEL - Abnormal; Notable for the following:    Salicylate Lvl <2.0 (*)    All other components within normal limits  URINE MICROSCOPIC-ADD ON - Abnormal; Notable for the following:    Bacteria, UA MANY (*)    All other components within normal limits  GLUCOSE, CAPILLARY - Abnormal; Notable for the following:    Glucose-Capillary 169 (*)    All other components within normal limits  URINE CULTURE  MRSA PCR SCREENING  CBC WITH DIFFERENTIAL  ETHANOL  AMMONIA  ACETAMINOPHEN LEVEL  CBC  CREATININE, SERUM   Dg Chest Port 1 View  08/28/2012   *RADIOLOGY REPORT*  Clinical  Data: Altered mental status.  PORTABLE CHEST - 1 VIEW  Comparison: 09/09/2010  Findings: Low lung volumes are seen, however there is no evidence of acute infiltrate or consolidation.  No evidence of pneumothorax or pleural effusion.  Cervical spine fusion hardware noted.  IMPRESSION: Low lung volumes.  No acute findings.   Original Report Authenticated By: Myles Rosenthal, M.D.     1. Overdose, initial encounter   2. Altered mental status      Date: 08/28/2012  Rate: 98  Rhythm: normal sinus rhythm  QRS Axis: normal  Intervals: normal  ST/T Wave abnormalities: normal  Conduction Disutrbances: none  Narrative Interpretation: unremarkable  CRITICAL CARE Performed by: Billee Cashing Total critical care time: 30 Critical care time was exclusive of separately billable procedures and treating other patients. Critical care was necessary to treat or prevent imminent or life-threatening deterioration. Critical care was time spent personally by me on the following activities: development of treatment plan with patient and/or surrogate as well as nursing, discussions with consultants, evaluation of patient's response to treatment, examination of patient, obtaining history from patient or surrogate, ordering and performing treatments and interventions, ordering and review of laboratory studies, ordering and review of radiographic studies, pulse oximetry and re-evaluation of patient's condition.    MDM  Patient with altered mental status. Likely related to overdose of medication. She has more of her Valium gone then if she used as directed. She was initially very sedate. Nasal airway was placed and she was able to tolerate it. He continued to be sedate but breathing spontaneously. She had a gag reflex intact. Lab work was significant for cocaine and benzodiazepines in the urine. She also has urinary tract infection. I do not feel she is septic at this time. His primary care Dr. is a family practice  Center and she was transferred to [facility]. She was admitted to step down unit. She required close monitoring for her airway status.        Juliet Rude. Rubin Payor, MD 08/28/12 [phone]

## 2012-08-27 NOTE — ED Notes (Signed)
Per EMS, pt's friends found pt unresponsive in a van, on scene pt resp 4 needing mechanical ventilation.  Pt given 3mg  Narcan, pt withdrawing from pain, still unresponsive.  Pt found with lighter.  Became slightly more responsive to pain after Narcan.  Pt currently 100% on [place]

## 2012-08-27 NOTE — ED Notes (Signed)
MD at bedside requesting nasal trumpet.

## 2012-08-28 ENCOUNTER — Encounter (HOSPITAL_COMMUNITY): Payer: Self-pay

## 2012-08-28 ENCOUNTER — Emergency Department (HOSPITAL_COMMUNITY): Payer: Medicaid Other

## 2012-08-28 DIAGNOSIS — T50901A Poisoning by unspecified drugs, medicaments and biological substances, accidental (unintentional), initial encounter: Secondary | ICD-10-CM

## 2012-08-28 DIAGNOSIS — T424X4A Poisoning by benzodiazepines, undetermined, initial encounter: Principal | ICD-10-CM

## 2012-08-28 DIAGNOSIS — F329 Major depressive disorder, single episode, unspecified: Secondary | ICD-10-CM

## 2012-08-28 DIAGNOSIS — F411 Generalized anxiety disorder: Secondary | ICD-10-CM

## 2012-08-28 DIAGNOSIS — T43502A Poisoning by unspecified antipsychotics and neuroleptics, intentional self-harm, initial encounter: Secondary | ICD-10-CM

## 2012-08-28 DIAGNOSIS — F319 Bipolar disorder, unspecified: Secondary | ICD-10-CM

## 2012-08-28 DIAGNOSIS — R4182 Altered mental status, unspecified: Secondary | ICD-10-CM

## 2012-08-28 LAB — COMPREHENSIVE METABOLIC PANEL
BUN: 6 mg/dL (ref 6–23)
CO2: 29 mEq/L (ref 19–32)
Calcium: 9.4 mg/dL (ref 8.4–10.5)
Chloride: 99 mEq/L (ref 96–112)
Creatinine, Ser: 0.76 mg/dL (ref 0.50–1.10)
GFR calc Af Amer: >90 mL/min (ref >90)
GFR calc non Af Amer: >90 mL/min (ref >90)
Glucose, Bld: 157 mg/dL — ABNORMAL HIGH (ref 70–99)
Total Bilirubin: 0.2 mg/dL — ABNORMAL LOW (ref 0.3–1.2)

## 2012-08-28 LAB — CBC WITH DIFFERENTIAL/PLATELET
Basophils Absolute: 0 10*3/uL (ref 0.0–0.1)
Eosinophils Relative: 3 % (ref 0–5)
HCT: 38.4 % (ref 36.0–46.0)
Hemoglobin: 12.8 g/dL (ref 12.0–15.0)
Lymphocytes Relative: 36 % (ref 12–46)
Lymphs Abs: 3 10*3/uL (ref 0.7–4.0)
MCV: 89.7 fL (ref 78.0–100.0)
Monocytes Absolute: 0.4 10*3/uL (ref 0.1–1.0)
Monocytes Relative: 5 % (ref 3–12)
RDW: 13.8 % (ref 11.5–15.5)
WBC: 8.2 10*3/uL (ref 4.0–10.5)

## 2012-08-28 LAB — CBC
HCT: 36.9 % (ref 36.0–46.0)
MCH: 30.3 pg (ref 26.0–34.0)
MCHC: 33.3 g/dL (ref 30.0–36.0)
MCV: 90.9 fL (ref 78.0–100.0)
Platelets: 261 10*3/uL (ref 150–400)
RDW: 14 % (ref 11.5–15.5)
WBC: 9.1 10*3/uL (ref 4.0–10.5)

## 2012-08-28 LAB — RAPID URINE DRUG SCREEN, HOSP PERFORMED
Cocaine: POSITIVE — AB
Opiates: NOT DETECTED

## 2012-08-28 LAB — GLUCOSE, CAPILLARY
Glucose-Capillary: 141 mg/dL — ABNORMAL HIGH (ref 70–99)
Glucose-Capillary: 126 mg/dL — ABNORMAL HIGH (ref 70–99)

## 2012-08-28 LAB — URINALYSIS, ROUTINE W REFLEX MICROSCOPIC
Glucose, UA: 100 mg/dL — AB
Protein, ur: NEGATIVE mg/dL
Specific Gravity, Urine: 1.012 (ref 1.005–1.030)

## 2012-08-28 LAB — SALICYLATE LEVEL: Salicylate Lvl: <2 mg/dL — ABNORMAL LOW (ref 2.8–20.0)

## 2012-08-28 LAB — ACETAMINOPHEN LEVEL: Acetaminophen (Tylenol), Serum: <15 ug/mL (ref 10–30)

## 2012-08-28 LAB — URINE MICROSCOPIC-ADD ON

## 2012-08-28 LAB — ETHANOL: Alcohol, Ethyl (B): <11 mg/dL (ref 0–11)

## 2012-08-28 LAB — MRSA PCR SCREENING: MRSA by PCR: NEGATIVE

## 2012-08-28 LAB — CREATININE, SERUM: GFR calc Af Amer: >90 mL/min (ref >90)

## 2012-08-28 MED ORDER — ADULT MULTIVITAMIN W/MINERALS CH
1.0000 | ORAL_TABLET | Freq: Every day | ORAL | Status: DC
  Administered 2012-08-28 – 2012-08-31 (×4): 1 via ORAL
  Filled 2012-08-28 (×4): qty 1

## 2012-08-28 MED ORDER — ATORVASTATIN CALCIUM 20 MG PO TABS
20.0000 mg | ORAL_TABLET | Freq: Every day | ORAL | Status: DC
  Administered 2012-08-28 – 2012-08-31 (×4): 20 mg via ORAL
  Filled 2012-08-28 (×4): qty 1

## 2012-08-28 MED ORDER — SENNA 8.6 MG PO TABS
1.0000 | ORAL_TABLET | Freq: Two times a day (BID) | ORAL | Status: DC
  Administered 2012-08-28 – 2012-08-31 (×5): 8.6 mg via ORAL
  Filled 2012-08-28 (×8): qty 1

## 2012-08-28 MED ORDER — SODIUM CHLORIDE 0.9 % IJ SOLN
3.0000 mL | Freq: Two times a day (BID) | INTRAMUSCULAR | Status: DC
  Administered 2012-08-28 – 2012-08-31 (×6): 3 mL via INTRAVENOUS

## 2012-08-28 MED ORDER — CITALOPRAM HYDROBROMIDE 40 MG PO TABS
40.0000 mg | ORAL_TABLET | Freq: Every day | ORAL | Status: DC
  Administered 2012-08-28 – 2012-08-31 (×4): 40 mg via ORAL
  Filled 2012-08-28 (×4): qty 1

## 2012-08-28 MED ORDER — HEPARIN SODIUM (PORCINE) 5000 UNIT/ML IJ SOLN
5000.0000 [IU] | Freq: Three times a day (TID) | INTRAMUSCULAR | Status: DC
  Administered 2012-08-28 – 2012-08-31 (×8): 5000 [IU] via SUBCUTANEOUS
  Filled 2012-08-28 (×12): qty 1

## 2012-08-28 MED ORDER — ONDANSETRON HCL 4 MG PO TABS: 4.0000 mg | ORAL_TABLET | Freq: Four times a day (QID) | ORAL | Status: DC | PRN

## 2012-08-28 MED ORDER — METFORMIN HCL 500 MG PO TABS
1000.0000 mg | ORAL_TABLET | Freq: Two times a day (BID) | ORAL | Status: DC
  Administered 2012-08-28 – 2012-08-31 (×7): 1000 mg via ORAL
  Filled 2012-08-28 (×11): qty 2

## 2012-08-28 MED ORDER — ONDANSETRON HCL 4 MG/2ML IJ SOLN: 4.0000 mg | Freq: Four times a day (QID) | INTRAMUSCULAR | Status: DC | PRN

## 2012-08-28 MED ORDER — LISINOPRIL 10 MG PO TABS
10.0000 mg | ORAL_TABLET | Freq: Every day | ORAL | Status: DC
  Administered 2012-08-28 – 2012-08-31 (×4): 10 mg via ORAL
  Filled 2012-08-28 (×4): qty 1

## 2012-08-28 MED ORDER — ACETAMINOPHEN 325 MG PO TABS
650.0000 mg | ORAL_TABLET | Freq: Four times a day (QID) | ORAL | Status: DC | PRN
  Administered 2012-08-28 – 2012-08-30 (×4): 650 mg via ORAL
  Filled 2012-08-28 (×4): qty 2

## 2012-08-28 MED ORDER — DEXTROSE-NACL 5-0.45 % IV SOLN: INTRAVENOUS | Status: DC

## 2012-08-28 MED ORDER — ACETAMINOPHEN 650 MG RE SUPP: 650.0000 mg | Freq: Four times a day (QID) | RECTAL | Status: DC | PRN

## 2012-08-28 MED ORDER — SODIUM CHLORIDE 0.9 % IV SOLN
INTRAVENOUS | Status: DC
  Administered 2012-08-28 – 2012-08-29 (×4): via INTRAVENOUS

## 2012-08-28 NOTE — Progress Notes (Signed)
Chaplain responded to spiritual care consult for suicidal thoughts. Pt had taken overdose of pills last night. Pt's husband and son were at bedside and Dr. Leveda Anna was with them with I arrived. After he left I entered the room and met pt, her husband, and son. Son help his dad overcome drug use a few years ago and hopes to do same for mom. Both pt and her husband asked me to pray with them. Pt seemed very subdued and regretful of having taken pills. The four of [place] joined hands and had prayer together. I stepped outside room while husband and son said their goodbyes before departing. Husband and son and I talked outside the room. Husband loves pt but is greatly discouraged by her inactivity and drug use over several years. He is believing things will change this time - with God's help - and wants wife to be in a drug rehab program. I was called to an end of life case but will be back to talk privately with pt.

## 2012-08-28 NOTE — ED Notes (Signed)
Spoke with [employer] at [place], about pt being IVC.  Stated that they will be unable to transport pt.

## 2012-08-28 NOTE — H&P (Addendum)
Seen and examined.  Discussed with Dr. Durene Cal.  Agree with his documentation and management. Briefly, I know [name] well as I am her PCP and have treated many members of her family.  Sadly, [name] is traveling down the same path as her sister [name] who died of an unintentional OD.  [name] has addiction issues, deep seeded psychiatric issues and relationship issues.  In addition to my scripts, she has been going to a pain clinic and misusing all of her prescriptions.  At DC, no controled substances and only psychoactive substances recommended by psych should be continued.  She looks like she will escape this episode with minimal or no medical harm.  Let's hope it sends her life on a better trajectory.

## 2012-08-28 NOTE — Progress Notes (Signed)
Utilization Review Completed.   Naythen Heikkila, RN, BSN Nurse Case Manager  [phone]  

## 2012-08-28 NOTE — ED Notes (Signed)
MD at bedside at this time.

## 2012-08-28 NOTE — Consult Note (Signed)
Reason for Consult: .Depression and suicidal attempt with benzo's; Cocaine intoxication  Referring Physician: Dr. Danne Harbor is an 47 y.o. female.  HPI: Patient is seen and chart reviewed. Patient was admitted to [facility] cardiology center for cardiac monitoring followed by suicidal attempt with benzo's. Patient presents with a history bipolar d/o, anxiety, HTN and DM that was brought to the [facility] ED via EMS after she was found unresponsive in her vehicle last night.  She has participated in psychiatric evaluation even though she is slow in verbal response. Patient reported that her medication lamictal was stopped because it is not working and is waiting to start lithium therapy. Patient admits to overdose on her current medicationg gabapentin, flexeril and valium in attempt to end her life. She stated that she has lot of psychosocial stresses including finances and substance abuse. Patient urine drug screen was positive for cocaine and benzodiazapine. Patient was treated by [facility] and she has no previous psychiatric in patient treatments.    Mental Status Examination: Patient has decreased psychomotor activity and has fair eye contact. Patient has depressed mood and affect was flat. She has normal low volume of speech. His thought process is linear and goal directed. Patient has suicidal ideation and made recent overdose with intention to end her life, she has no homicidal ideations, intentions or plans. Patient has no evidence of auditory or visual hallucinations, delusions, and paranoia. Patient has poor insight judgment and impulse control.  Past Medical History  Diagnosis Date  . Diabetes mellitus   . High cholesterol   . Depression   . Bipolar 1 disorder     Past Surgical History  Procedure Laterality Date  . Arm surgery    . Knee surgery    . Appendectomy    . Cholecystectomy    . Cesarean section    . Neck surgery      History reviewed. No pertinent family  history.  Social History:  reports that she has been smoking Cigarettes.  She has been smoking about 1.00 pack per day. She does not have any smokeless tobacco history on file. She reports that she does not drink alcohol or use illicit drugs.  Allergies:  Allergies  Allergen Reactions  . Methadone Hcl Swelling  . Morphine And Related Itching  . Propranolol Nausea Only    Medications: I have reviewed the patient's current medications.  Results for orders placed during the hospital encounter of 08/27/12 (from the past 48 hour(s))  CBC WITH DIFFERENTIAL     Status: None   Collection Time    08/28/12 12:05 AM      Result Value Range   WBC 8.2  4.0 - 10.5 K/uL   RBC 4.28  3.87 - 5.11 MIL/uL   Hemoglobin 12.8  12.0 - 15.0 g/dL   HCT 21.3  08.6 - 57.8 %   MCV 89.7  78.0 - 100.0 fL   MCH 29.9  26.0 - 34.0 pg   MCHC 33.3  30.0 - 36.0 g/dL   RDW 46.9  62.9 - 52.8 %   Platelets 269  150 - 400 K/uL   Neutrophils Relative % 56  43 - 77 %   Neutro Abs 4.6  1.7 - 7.7 K/uL   Lymphocytes Relative 36  12 - 46 %   Lymphs Abs 3.0  0.7 - 4.0 K/uL   Monocytes Relative 5  3 - 12 %   Monocytes Absolute 0.4  0.1 - 1.0 K/uL  Eosinophils Relative 3  0 - 5 %   Eosinophils Absolute 0.2  0.0 - 0.7 K/uL   Basophils Relative 0  0 - 1 %   Basophils Absolute 0.0  0.0 - 0.1 K/uL  COMPREHENSIVE METABOLIC PANEL     Status: Abnormal   Collection Time    08/28/12 12:05 AM      Result Value Range   Sodium 136  135 - 145 mEq/L   Potassium 3.5  3.5 - 5.1 mEq/L   Chloride 99  96 - 112 mEq/L   CO2 29  19 - 32 mEq/L   Glucose, Bld 157 (*) 70 - 99 mg/dL   BUN 6  6 - 23 mg/dL   Creatinine, Ser 1.61  0.50 - 1.10 mg/dL   Calcium 9.4  8.4 - 09.6 mg/dL   Total Protein 7.5  6.0 - 8.3 g/dL   Albumin 3.9  3.5 - 5.2 g/dL   AST 28  0 - 37 U/L   ALT 29  0 - 35 U/L   Alkaline Phosphatase 78  39 - 117 U/L   Total Bilirubin 0.2 (*) 0.3 - 1.2 mg/dL   GFR calc non Af Amer >90  >90 mL/min   GFR calc Af Amer >90  >90  mL/min   Comment:            The eGFR has been calculated     using the CKD EPI equation.     This calculation has not been     validated in all clinical     situations.     eGFR's persistently     <90 mL/min signify     possible Chronic Kidney Disease.  ETHANOL     Status: None   Collection Time    08/28/12 12:05 AM      Result Value Range   Alcohol, Ethyl (B) <11  0 - 11 mg/dL   Comment:            LOWEST DETECTABLE LIMIT FOR     SERUM ALCOHOL IS 11 mg/dL     FOR MEDICAL PURPOSES ONLY  AMMONIA     Status: None   Collection Time    08/28/12 12:05 AM      Result Value Range   Ammonia 30  11 - 60 umol/L  ACETAMINOPHEN LEVEL     Status: None   Collection Time    08/28/12 12:05 AM      Result Value Range   Acetaminophen (Tylenol), Serum <15.0  10 - 30 ug/mL   Comment:            THERAPEUTIC CONCENTRATIONS VARY     SIGNIFICANTLY. A RANGE OF 10-30     ug/mL MAY BE AN EFFECTIVE     CONCENTRATION FOR MANY PATIENTS.     HOWEVER, SOME ARE BEST TREATED     AT CONCENTRATIONS OUTSIDE THIS     RANGE.     ACETAMINOPHEN CONCENTRATIONS     >150 ug/mL AT 4 HOURS AFTER     INGESTION AND >50 ug/mL AT 12     HOURS AFTER INGESTION ARE     OFTEN ASSOCIATED WITH TOXIC     REACTIONS.  SALICYLATE LEVEL     Status: Abnormal   Collection Time    08/28/12 12:05 AM      Result Value Range   Salicylate Lvl <2.0 (*) 2.8 - 20.0 mg/dL  URINALYSIS, ROUTINE W REFLEX MICROSCOPIC     Status: Abnormal   Collection Time  08/28/12 12:23 AM      Result Value Range   Color, Urine YELLOW  YELLOW   APPearance CLOUDY (*) CLEAR   Specific Gravity, Urine 1.012  1.005 - 1.030   pH 5.5  5.0 - 8.0   Glucose, UA 100 (*) NEGATIVE mg/dL   Hgb urine dipstick TRACE (*) NEGATIVE   Bilirubin Urine NEGATIVE  NEGATIVE   Ketones, ur NEGATIVE  NEGATIVE mg/dL   Protein, ur NEGATIVE  NEGATIVE mg/dL   Urobilinogen, UA 0.2  0.0 - 1.0 mg/dL   Nitrite POSITIVE (*) NEGATIVE   Leukocytes, UA LARGE (*) NEGATIVE  URINE  MICROSCOPIC-ADD ON     Status: Abnormal   Collection Time    08/28/12 12:23 AM      Result Value Range   Squamous Epithelial / LPF RARE  RARE   WBC, UA 3-6  <3 WBC/hpf   Bacteria, UA MANY (*) RARE  URINE RAPID DRUG SCREEN (HOSP PERFORMED)     Status: Abnormal   Collection Time    08/28/12 12:24 AM      Result Value Range   Opiates NONE DETECTED  NONE DETECTED   Cocaine POSITIVE (*) NONE DETECTED   Benzodiazepines POSITIVE (*) NONE DETECTED   Amphetamines NONE DETECTED  NONE DETECTED   Tetrahydrocannabinol NONE DETECTED  NONE DETECTED   Barbiturates NONE DETECTED  NONE DETECTED   Comment:            DRUG SCREEN FOR MEDICAL PURPOSES     ONLY.  IF CONFIRMATION IS NEEDED     FOR ANY PURPOSE, NOTIFY LAB     WITHIN 5 DAYS.                LOWEST DETECTABLE LIMITS     FOR URINE DRUG SCREEN     Drug Class       Cutoff (ng/mL)     Amphetamine      1000     Barbiturate      200     Benzodiazepine   200     Tricyclics       300     Opiates          300     Cocaine          300     THC              50  MRSA PCR SCREENING     Status: None   Collection Time    08/28/12  4:49 AM      Result Value Range   MRSA by PCR NEGATIVE  NEGATIVE   Comment:            The GeneXpert MRSA Assay (FDA     approved for NASAL specimens     only), is one component of a     comprehensive MRSA colonization     surveillance program. It is not     intended to diagnose MRSA     infection nor to guide or     monitor treatment for     MRSA infections.  GLUCOSE, CAPILLARY     Status: Abnormal   Collection Time    08/28/12  5:20 AM      Result Value Range   Glucose-Capillary 169 (*) 70 - 99 mg/dL  GLUCOSE, CAPILLARY     Status: Abnormal   Collection Time    08/28/12  8:05 AM      Result Value Range   Glucose-Capillary 156 (*)  70 - 99 mg/dL   Comment 1 Notify RN    CBC     Status: None   Collection Time    08/28/12  8:45 AM      Result Value Range   WBC 9.1  4.0 - 10.5 K/uL   RBC 4.06  3.87 - 5.11  MIL/uL   Hemoglobin 12.3  12.0 - 15.0 g/dL   HCT 95.6  38.7 - 56.4 %   MCV 90.9  78.0 - 100.0 fL   MCH 30.3  26.0 - 34.0 pg   MCHC 33.3  30.0 - 36.0 g/dL   RDW 33.2  95.1 - 88.4 %   Platelets 261  150 - 400 K/uL  CREATININE, SERUM     Status: None   Collection Time    08/28/12  8:45 AM      Result Value Range   Creatinine, Ser 0.77  0.50 - 1.10 mg/dL   GFR calc non Af Amer >90  >90 mL/min   GFR calc Af Amer >90  >90 mL/min   Comment:            The eGFR has been calculated     using the CKD EPI equation.     This calculation has not been     validated in all clinical     situations.     eGFR's persistently     <90 mL/min signify     possible Chronic Kidney Disease.    Dg Chest Port 1 View  08/28/2012   *RADIOLOGY REPORT*  Clinical Data: Altered mental status.  PORTABLE CHEST - 1 VIEW  Comparison: 09/09/2010  Findings: Low lung volumes are seen, however there is no evidence of acute infiltrate or consolidation.  No evidence of pneumothorax or pleural effusion.  Cervical spine fusion hardware noted.  IMPRESSION: Low lung volumes.  No acute findings.   Original Report Authenticated By: Myles Rosenthal, M.D.    Positive for anxiety, bad mood, behavior problems, bipolar, depression, illegal drug usage, mood swings, sleep disturbance, tobacco use and suicidal attempt with overdose of medication and cocaine Blood pressure 111/73, pulse 89, temperature 98 F (36.7 C), temperature source Oral, resp. rate 15, height 5\' 5"  (1.651 m), weight 234 lb 9.1 oz (106.4 kg), SpO2 92.00%.   Assessment/Plan: Depression and suicidal attempt with benzo's Cocaine intoxication UDS is positive for cocaine and benzo's  Recommendation:  1. Recommended for acute inpatient psychiatric hospitalization for crisis stabilization when medically cleared 2. Continue sitter for safety  3. No additional medications recommended 4. Appreciate psych consultation and follow as needed  Urbano Milhouse,JANARDHAHA  R. 08/28/2012, 11:39 AM

## 2012-08-28 NOTE — H&P (Signed)
Family Medicine Teaching [facility] Admission History and Physical Service Pager: [phone]  Patient name: [name]  Medical record number: [id number]  Date of birth: 1966-02-14 Age: 47 y.o.    Primary Care Provider: Sanjuana Letters, MD  Chief Complaint: Overdose  History of Present Illness: [name] is a 47 y.o.  Female with a history bipolar d/o, anxiety, HTN and DM that was brought to the [facility] ED via EMS after she was found unresponsive in her vehicle last night. Urine drug screen was positive for cocaine and benzodiazapine. She sleeping in bed, but awakes to her name and is able to carry on a conversation appropriately. She is tired and mildly doses off, closes eyes during conversation. The last event she remembers is eating at [facility] yesterday, then she admits to taking gabapentin, flexeril and valium in attempt to take her life. She reports she has "a whole lot of things going on giving her reasons." She endorses cocaine use 2 nights ago. She reports she does not take any other recreational drugs or drink alcohol. She does not currently have suicidal ideations and feels that what she did was, "stupid." She endorses back pain (chronic). Does not have complaints of dysuria or polyuria.  No recent fever/chills.  She is thirsty and asking for water.    Patient Active Problem List   Diagnosis Date Noted  . Tobacco abuse 08/20/2011  . Elevated transaminase level 08/15/2011  . Irritable bowel syndrome (IBS) 08/13/2011  . Bipolar 1 disorder 08/13/2011  . Diabetes mellitus type 2 in obese 08/13/2011  . Obesity 08/13/2011  . Cervical spine disease 08/13/2011  . Low back pain 08/13/2011  . Anxiety 08/13/2011  . Hypertension 08/13/2011    Past Medical History  Diagnosis Date  . Diabetes mellitus   . High cholesterol   . Depression   . Bipolar 1 disorder     Past Surgical History  Procedure Laterality Date  . Arm surgery    . Knee surgery    . Appendectomy    .  Cholecystectomy    . Cesarean section    . Neck surgery      History   Social History  . Marital Status: Married   . Smoking status: Current Every Day Smoker -- 1.00 packs/day    Types: Cigarettes  .She is married and she, with her husband, live in her mother-in-law's home. Her husband is not present with her currently, but has been with her at [facility].     History reviewed. No pertinent family history.  Allergies  Allergen Reactions  . Methadone Hcl Swelling  . Morphine And Related Itching  . Propranolol Nausea Only    No current facility-administered medications for this encounter.  Review Of Systems: Per HPI  Otherwise 12 point review of systems was performed and was unremarkable.  Physical Exam: BP 111/73  Pulse 89  Temp(Src) 98 F (36.7 C) (Oral)  Resp 15  Ht 5\' 5"  (1.651 m)  Wt 234 lb 9.1 oz (106.4 kg)  BMI 39.03 kg/m2  SpO2 92%  Exam: General: Obese, appears older than age, sedated female with nasal trumpet in left nostril. Speech is difficult to understand, mild slurring.  HEENT: AT.[place]. Pupils dilated but equal and reactive bilaterally. Bilateral eyes are without icterus, drainage or injections. BIlateral nares are patent with no drainage or deformity. NAsal trumpet located in left nostril. Mucous membranes are dry. No erythema or exudates of the throat. Neck: Supple. No LAD. Cardiovascular: RRR. No murmur appreciated. Respiratory:  Bibasilar fine crackles, no wheeze or rhonchi.  Abdomen: Obese, soft, NTND. BS present.  Extremities: Normal ROM. No erythema , trace edema bilaterally. +2/4 pulses UE/LE.  Skin: No abrasions or rashes noted.  Neuro: PERLA. EOMi. Alert. Oriented. No focal deficits, grossly intact. MSK: pain with palpation in paraspinous muscles of lumbar spine without pain over spinal column. No CVA tenderness.   Labs and Imaging: Dg Chest Port 1 View  08/28/2012   *RADIOLOGY REPORT*  Clinical Data: Altered mental status.  PORTABLE CHEST - 1 VIEW   Comparison: 09/09/2010  Findings: Low lung volumes are seen, however there is no evidence of acute infiltrate or consolidation.  No evidence of pneumothorax or pleural effusion.  Cervical spine fusion hardware noted.  IMPRESSION: Low lung volumes.  No acute findings.   Original Report Authenticated By: Myles Rosenthal, M.D.    Results for orders placed during the hospital encounter of 08/27/12 (from the past 24 hour(s))  CBC WITH DIFFERENTIAL     Status: None   Collection Time    08/28/12 12:05 AM      Result Value Range   WBC 8.2  4.0 - 10.5 K/uL   RBC 4.28  3.87 - 5.11 MIL/uL   Hemoglobin 12.8  12.0 - 15.0 g/dL   HCT 62.1  30.8 - 65.7 %   MCV 89.7  78.0 - 100.0 fL   MCH 29.9  26.0 - 34.0 pg   MCHC 33.3  30.0 - 36.0 g/dL   RDW 84.6  96.2 - 95.2 %   Platelets 269  150 - 400 K/uL   Neutrophils Relative % 56  43 - 77 %   Neutro Abs 4.6  1.7 - 7.7 K/uL   Lymphocytes Relative 36  12 - 46 %   Lymphs Abs 3.0  0.7 - 4.0 K/uL   Monocytes Relative 5  3 - 12 %   Monocytes Absolute 0.4  0.1 - 1.0 K/uL   Eosinophils Relative 3  0 - 5 %   Eosinophils Absolute 0.2  0.0 - 0.7 K/uL   Basophils Relative 0  0 - 1 %   Basophils Absolute 0.0  0.0 - 0.1 K/uL  COMPREHENSIVE METABOLIC PANEL     Status: Abnormal   Collection Time    08/28/12 12:05 AM      Result Value Range   Sodium 136  135 - 145 mEq/L   Potassium 3.5  3.5 - 5.1 mEq/L   Chloride 99  96 - 112 mEq/L   CO2 29  19 - 32 mEq/L   Glucose, Bld 157 (*) 70 - 99 mg/dL   BUN 6  6 - 23 mg/dL   Creatinine, Ser 8.41  0.50 - 1.10 mg/dL   Calcium 9.4  8.4 - 32.4 mg/dL   Total Protein 7.5  6.0 - 8.3 g/dL   Albumin 3.9  3.5 - 5.2 g/dL   AST 28  0 - 37 U/L   ALT 29  0 - 35 U/L   Alkaline Phosphatase 78  39 - 117 U/L   Total Bilirubin 0.2 (*) 0.3 - 1.2 mg/dL   GFR calc non Af Amer >90  >90 mL/min   GFR calc Af Amer >90  >90 mL/min  ETHANOL     Status: None   Collection Time    08/28/12 12:05 AM      Result Value Range   Alcohol, Ethyl (B) <11  0 -  11 mg/dL  AMMONIA     Status: None  Collection Time    08/28/12 12:05 AM      Result Value Range   Ammonia 30  11 - 60 umol/L  ACETAMINOPHEN LEVEL     Status: None   Collection Time    08/28/12 12:05 AM      Result Value Range   Acetaminophen (Tylenol), Serum <15.0  10 - 30 ug/mL  SALICYLATE LEVEL     Status: Abnormal   Collection Time    08/28/12 12:05 AM      Result Value Range   Salicylate Lvl <2.0 (*) 2.8 - 20.0 mg/dL  URINALYSIS, ROUTINE W REFLEX MICROSCOPIC     Status: Abnormal   Collection Time    08/28/12 12:23 AM      Result Value Range   Color, Urine YELLOW  YELLOW   APPearance CLOUDY (*) CLEAR   Specific Gravity, Urine 1.012  1.005 - 1.030   pH 5.5  5.0 - 8.0   Glucose, UA 100 (*) NEGATIVE mg/dL   Hgb urine dipstick TRACE (*) NEGATIVE   Bilirubin Urine NEGATIVE  NEGATIVE   Ketones, ur NEGATIVE  NEGATIVE mg/dL   Protein, ur NEGATIVE  NEGATIVE mg/dL   Urobilinogen, UA 0.2  0.0 - 1.0 mg/dL   Nitrite POSITIVE (*) NEGATIVE   Leukocytes, UA LARGE (*) NEGATIVE  URINE MICROSCOPIC-ADD ON     Status: Abnormal   Collection Time    08/28/12 12:23 AM      Result Value Range   Squamous Epithelial / LPF RARE  RARE   WBC, UA 3-6  <3 WBC/hpf   Bacteria, UA MANY (*) RARE  URINE RAPID DRUG SCREEN (HOSP PERFORMED)     Status: Abnormal   Collection Time    08/28/12 12:24 AM      Result Value Range   Opiates NONE DETECTED  NONE DETECTED   Cocaine POSITIVE (*) NONE DETECTED   Benzodiazepines POSITIVE (*) NONE DETECTED   Amphetamines NONE DETECTED  NONE DETECTED   Tetrahydrocannabinol NONE DETECTED  NONE DETECTED   Barbiturates NONE DETECTED  NONE DETECTED     Assessment and Plan: [name] is 47 y.o.  female with a history bipolar d/o, anxiety, HTN and DM that was brought to the [facility] ED via EMS after she was found unresponsive in her vehicle last night.  Cocaine and benzodiazapine positive. CXR clear. Labs otherwise unremarkable.   Overdose: Intentional overdose in an  attempt to commit suicide. She reports this is the first time she has tried to kill herself, and that she now feels it was "stupid." She reports she has "many reasons."  - Discontinued all sedating medications for now - Will add back low dose benzo (consider 1/2 dose or longer acting benzo) later today when she is more alert, to prevent withdraw/seizure - Psych consult for overdose.  - Consulted SW - Consulted spiritual care  Anxiety/depression/bipolar: Patient with history of bipolar d/o and taken off Lamictal, per patient and not started on anything else. Per office note in 03/2012 patient was prescribed low dose Seroquel.   - Continued Celexa  - Did not continue valium. Patient will need low dose benzo added once she becomes more alert.   Chronic Pain: Patient complains of back pain and takes gabapentin and flexeril. Did not continue either until she is more alert.  - Tylenol for pain.   Diabetes: Last A1c (03/2012) 7.0. Will continue metformin  HTN: will continue Lisinopril  ?UTI: Patient without symptoms. Not TTP suprapubic. AFebrile. - Urine culture pending.  -  UA concerning for UTI, will readdress symptoms when patient more awake.   FEN/GI: NPO for now, will advance diet as she becomes more alert. MIVF NS.  Prophylaxis: Hep Sq Disposition: Pending psych/social consults and their recommendations.  Code Status: Full, did not address currently. Will re-visit when she is more mentally clear.   Felix Pacini DO    Family Medicine Resident PGY-1 08/28/2012, 5:20 AM  Family Medicine Upper Level Addendum:   I have seen and examined the patient independently, discussed with Dr. Claiborne Billings, fully reviewed the H+P and agree with it's contents with the additions as noted in blue text.    Tana Conch, MD, PGY2 08/28/2012 7:08 AM

## 2012-08-29 LAB — GLUCOSE, CAPILLARY: Glucose-Capillary: 143 mg/dL — ABNORMAL HIGH (ref 70–99)

## 2012-08-29 MED ORDER — KETOROLAC TROMETHAMINE 30 MG/ML IJ SOLN
30.0000 mg | Freq: Four times a day (QID) | INTRAMUSCULAR | Status: DC
Start: 2012-08-29 — End: 2012-08-30
  Administered 2012-08-29 – 2012-08-30 (×3): 30 mg via INTRAVENOUS
  Filled 2012-08-29 (×4): qty 1

## 2012-08-29 MED ORDER — DIAZEPAM 2 MG PO TABS
2.0000 mg | ORAL_TABLET | Freq: Two times a day (BID) | ORAL | Status: DC
  Administered 2012-08-29 – 2012-08-31 (×5): 2 mg via ORAL
  Filled 2012-08-29 (×5): qty 1

## 2012-08-29 MED ORDER — PROMETHAZINE HCL 25 MG/ML IJ SOLN: 25.0000 mg | Freq: Once | INTRAMUSCULAR | Status: DC

## 2012-08-29 MED ORDER — DEXTROSE 5 % IV SOLN
1.0000 g | INTRAVENOUS | Status: AC
  Administered 2012-08-29 – 2012-08-31 (×3): 1 g via INTRAVENOUS
  Filled 2012-08-29 (×3): qty 10

## 2012-08-29 MED ORDER — KETOROLAC TROMETHAMINE 30 MG/ML IJ SOLN
30.0000 mg | Freq: Once | INTRAMUSCULAR | Status: AC
  Administered 2012-08-29: 30 mg via INTRAVENOUS
  Filled 2012-08-29: qty 1

## 2012-08-29 MED ORDER — LITHIUM CARBONATE ER 300 MG PO TBCR
300.0000 mg | EXTENDED_RELEASE_TABLET | Freq: Two times a day (BID) | ORAL | Status: DC
  Administered 2012-08-29 – 2012-08-31 (×4): 300 mg via ORAL
  Filled 2012-08-29 (×6): qty 1

## 2012-08-29 NOTE — Progress Notes (Signed)
Family Medicine Teaching Service Brief Note Service Pager: [phone]  Patient name: [name] Medical record number: [id number] Date of birth: 23-May-1965 Age: 47 y.o. Gender: female Length of Stay:  LOS: 2 days   Patient has been transferred out of the step down unit and to the floor.  She is complaining of persistent low back pain that is a chronic problem for her.  She does report seeing a pain clinic however we have no records to coorborate her chronic pain regimen and she was also opioid negative on admission indicating that if she has a valid current prescription, that she has miss using her chronic medications as opioids would be expected to be found if she has a prescription.     In the setting of non-accidental overdose of multiple medications continuing with narcotics under these circumstances is contraindicated.  I would be more than happy to aggressively treat her pain with non-opioid pain medications and will continue with Tylenol and start Tordol acutely (Liver and Kidney's are functioning well).  The pt is currently on Valium as functional muscle relaxer.  If the patient does continue to experience low back pain we will continue with supportive measures however opioids are not an option at this point.   I have discussed this with Dr. Leveda Anna, FMTS current attending as well as the patient's primary care physician.  He is in complete agreement with this plan and agrees given the current reason for hospitalization opioids are contraindicated for mechanical back pain.  Andrena Mews, DO [facility] Resident - PGY-2 08/29/2012 4:13 PM

## 2012-08-29 NOTE — Progress Notes (Signed)
Patient arrived to room 6735.  Patient is alert and oriented with complaints of lower back pain.  Tylenol given.  Vital signs are stable.  Sitter is in place.  Environment has been cleared of all contraband.  Will continue to monitor.

## 2012-08-29 NOTE — Progress Notes (Signed)
Seen and examined.  Much more alert today.  Still profoundly depressed and suicidal.  Await psych.  Medically stable.  Likely transfer to inpatient psych when bed available.

## 2012-08-29 NOTE — Progress Notes (Signed)
Family Medicine Teaching Service Daily Progress Note Pager [phone]  Subjective: Interval History: patient admits to persistent suicidal ideation. She is concerned that she needs additional treatment for her bipolar depression. She is open to therapy. She is concerned that she needs additional management of her chronic low back pain. She admits to dysuria x 3 weeks. She noticed hematuria x one episode about two weeks ago. She had some flank pain. She denies fever, nausea and vomiting   Objective: Vital signs in last 24 hours: Temp:  [97.8 F (36.6 C)-98.5 F (36.9 C)] 97.8 F (36.6 C) (06/01 0803) Pulse Rate:  [82-95] 91 (06/01 0803) Resp:  [14-21] 19 (06/01 0803) BP: (108-149)/(53-75) 118/68 mmHg (06/01 0803) SpO2:  [94 %-99 %] 99 % (06/01 0803)  Intake/Output from previous day: 05/31 0701 - 06/01 0700 In: 5130 [P.O.:1680; I.V.:3450] Out: 3025 [Urine:3025] Intake/Output this shift: Total I/O In: 120 [P.O.:120] Out: 650 [Urine:650]  BP 118/68  Pulse 91  Temp(Src) 97.8 F (36.6 C) (Oral)  Resp 19  Ht 5\' 5"  (1.651 m)  Wt 234 lb 9.1 oz (106.4 kg)  BMI 39.03 kg/m2  SpO2 99% General appearance: alert, cooperative and no distress Back: symmetric, no curvature. ROM normal. No CVA tenderness. Lungs: clear to auscultation bilaterally Heart: regular rate and rhythm, S1, S2 normal, no murmur, click, rub or gallop Abdomen: soft, non-tender; bowel sounds normal; no masses,  no organomegaly Extremities: extremities normal, atraumatic, no cyanosis or edema  Lab Results  Component Value Date   HGBA1C 7.0 04/30/2012    Results for orders placed during the hospital encounter of 08/27/12 (from the past 24 hour(s))  GLUCOSE, CAPILLARY     Status: Abnormal   Collection Time    08/28/12 12:28 PM      Result Value Range   Glucose-Capillary 141 (*) 70 - 99 mg/dL   Comment 1 Notify RN    GLUCOSE, CAPILLARY     Status: Abnormal   Collection Time    08/28/12  5:19 PM      Result Value  Range   Glucose-Capillary 126 (*) 70 - 99 mg/dL  GLUCOSE, CAPILLARY     Status: Abnormal   Collection Time    08/28/12  9:09 PM      Result Value Range   Glucose-Capillary 180 (*) 70 - 99 mg/dL  GLUCOSE, CAPILLARY     Status: Abnormal   Collection Time    08/29/12  8:11 AM      Result Value Range   Glucose-Capillary 143 (*) 70 - 99 mg/dL   Studies/Results: Dg Chest Port 1 View  08/28/2012   *RADIOLOGY REPORT*  Clinical Data: Altered mental status.  PORTABLE CHEST - 1 VIEW  Comparison: 09/09/2010  Findings: Low lung volumes are seen, however there is no evidence of acute infiltrate or consolidation.  No evidence of pneumothorax or pleural effusion.  Cervical spine fusion hardware noted.  IMPRESSION: Low lung volumes.  No acute findings.   Original Report Authenticated By: Myles Rosenthal, M.D.   Scheduled Meds: reviewed all scheduled and prn medications.   Assessment/Plan: 1. Suicidal ideation and attempt: A: persistent SI. Sitter at bedside. No evidence of BZ withdrawal. Patient was seen by Dr. Elsie Saas yesterday evening who recommends inpatient psych. I agree with inpatient psych. I feel that the patient is currently medically stable. Limitations are psych bed availability and she is starting IV antibiotic therapy for UTI but empiric PO therapy is just as reasonable.  P:  Transfer to floor bed, continue tele Start  valium at 2 mg BID to prevent BZ withdrawal Await psych placement.   2. UTI:  A: UA consistent with UTI and patient symptomatic. P: rocephin pending urine culture. If patient has a psych bed and urine culture is still pending this can be changed to keflex.   3. NIDDM: CBGs at goal. Continue metformin.  4. DVT PPX: heparin.  5. Code: Full.   6. Dispo: to in patient psych. Awaiting bed.     LOS: 2 days   [name] 10:06 AM, 08/29/2012

## 2012-08-29 NOTE — Consult Note (Signed)
Reason for Consult: .Depression and suicidal attempt with benzo's; Cocaine intoxication  Referring Physician: Dr. Danne Harbor is an 47 y.o. female.  HPI: Patient was moved to step down unit and case discussed with Dr. Konrad Dolores. Patient was admitted followed by suicidal attempt with benzo's. Patient presents with a history bipolar d/o, anxiety, was brought to the hospital after she was found unresponsive in her vehicle last night. Patient reported that her medication lamictal was stopped because it is not working and is waiting to start lithium therapy. Patient admits to overdose on her current medication gabapentin, flexeril and valium in attempt to end her life. She has lot of psychosocial stresses including finances and substance abuse. Patient urine drug screen was positive for cocaine and benzodiazapine. Patient was treated by [facility] and she has no previous psychiatric in patient treatments.    Mental Status Examination: Patient has decreased psychomotor activity and has fair eye contact. Patient has depressed mood and affect was flat. She has normal low volume of speech. His thought process is linear and goal directed. Patient has suicidal ideation and made recent overdose with intention to end her life, she has no homicidal ideations, intentions or plans. Patient has no evidence of auditory or visual hallucinations, delusions, and paranoia. Patient has poor insight judgment and impulse control.  Past Medical History  Diagnosis Date  . Diabetes mellitus   . High cholesterol   . Depression   . Bipolar 1 disorder     Past Surgical History  Procedure Laterality Date  . Arm surgery    . Knee surgery    . Appendectomy    . Cholecystectomy    . Cesarean section    . Neck surgery      History reviewed. No pertinent family history.  Social History:  reports that she has been smoking Cigarettes.  She has been smoking about 1.00 pack per day. She does not have any  smokeless tobacco history on file. She reports that she does not drink alcohol or use illicit drugs.  Allergies:  Allergies  Allergen Reactions  . Methadone Hcl Swelling  . Morphine And Related Itching  . Propranolol Nausea Only    Medications: I have reviewed the patient's current medications.  Results for orders placed during the hospital encounter of 08/27/12 (from the past 48 hour(s))  CBC WITH DIFFERENTIAL     Status: None   Collection Time    08/28/12 12:05 AM      Result Value Range   WBC 8.2  4.0 - 10.5 K/uL   RBC 4.28  3.87 - 5.11 MIL/uL   Hemoglobin 12.8  12.0 - 15.0 g/dL   HCT 21.3  08.6 - 57.8 %   MCV 89.7  78.0 - 100.0 fL   MCH 29.9  26.0 - 34.0 pg   MCHC 33.3  30.0 - 36.0 g/dL   RDW 46.9  62.9 - 52.8 %   Platelets 269  150 - 400 K/uL   Neutrophils Relative % 56  43 - 77 %   Neutro Abs 4.6  1.7 - 7.7 K/uL   Lymphocytes Relative 36  12 - 46 %   Lymphs Abs 3.0  0.7 - 4.0 K/uL   Monocytes Relative 5  3 - 12 %   Monocytes Absolute 0.4  0.1 - 1.0 K/uL   Eosinophils Relative 3  0 - 5 %   Eosinophils Absolute 0.2  0.0 - 0.7 K/uL   Basophils Relative 0  0 -  1 %   Basophils Absolute 0.0  0.0 - 0.1 K/uL  COMPREHENSIVE METABOLIC PANEL     Status: Abnormal   Collection Time    08/28/12 12:05 AM      Result Value Range   Sodium 136  135 - 145 mEq/L   Potassium 3.5  3.5 - 5.1 mEq/L   Chloride 99  96 - 112 mEq/L   CO2 29  19 - 32 mEq/L   Glucose, Bld 157 (*) 70 - 99 mg/dL   BUN 6  6 - 23 mg/dL   Creatinine, Ser 9.60  0.50 - 1.10 mg/dL   Calcium 9.4  8.4 - 45.4 mg/dL   Total Protein 7.5  6.0 - 8.3 g/dL   Albumin 3.9  3.5 - 5.2 g/dL   AST 28  0 - 37 U/L   ALT 29  0 - 35 U/L   Alkaline Phosphatase 78  39 - 117 U/L   Total Bilirubin 0.2 (*) 0.3 - 1.2 mg/dL   GFR calc non Af Amer >90  >90 mL/min   GFR calc Af Amer >90  >90 mL/min   Comment:            The eGFR has been calculated     using the CKD EPI equation.     This calculation has not been     validated in  all clinical     situations.     eGFR's persistently     <90 mL/min signify     possible Chronic Kidney Disease.  ETHANOL     Status: None   Collection Time    08/28/12 12:05 AM      Result Value Range   Alcohol, Ethyl (B) <11  0 - 11 mg/dL   Comment:            LOWEST DETECTABLE LIMIT FOR     SERUM ALCOHOL IS 11 mg/dL     FOR MEDICAL PURPOSES ONLY  AMMONIA     Status: None   Collection Time    08/28/12 12:05 AM      Result Value Range   Ammonia 30  11 - 60 umol/L  ACETAMINOPHEN LEVEL     Status: None   Collection Time    08/28/12 12:05 AM      Result Value Range   Acetaminophen (Tylenol), Serum <15.0  10 - 30 ug/mL   Comment:            THERAPEUTIC CONCENTRATIONS VARY     SIGNIFICANTLY. A RANGE OF 10-30     ug/mL MAY BE AN EFFECTIVE     CONCENTRATION FOR MANY PATIENTS.     HOWEVER, SOME ARE BEST TREATED     AT CONCENTRATIONS OUTSIDE THIS     RANGE.     ACETAMINOPHEN CONCENTRATIONS     >150 ug/mL AT 4 HOURS AFTER     INGESTION AND >50 ug/mL AT 12     HOURS AFTER INGESTION ARE     OFTEN ASSOCIATED WITH TOXIC     REACTIONS.  SALICYLATE LEVEL     Status: Abnormal   Collection Time    08/28/12 12:05 AM      Result Value Range   Salicylate Lvl <2.0 (*) 2.8 - 20.0 mg/dL  URINALYSIS, ROUTINE W REFLEX MICROSCOPIC     Status: Abnormal   Collection Time    08/28/12 12:23 AM      Result Value Range   Color, Urine YELLOW  YELLOW   APPearance CLOUDY (*)  CLEAR   Specific Gravity, Urine 1.012  1.005 - 1.030   pH 5.5  5.0 - 8.0   Glucose, UA 100 (*) NEGATIVE mg/dL   Hgb urine dipstick TRACE (*) NEGATIVE   Bilirubin Urine NEGATIVE  NEGATIVE   Ketones, ur NEGATIVE  NEGATIVE mg/dL   Protein, ur NEGATIVE  NEGATIVE mg/dL   Urobilinogen, UA 0.2  0.0 - 1.0 mg/dL   Nitrite POSITIVE (*) NEGATIVE   Leukocytes, UA LARGE (*) NEGATIVE  URINE MICROSCOPIC-ADD ON     Status: Abnormal   Collection Time    08/28/12 12:23 AM      Result Value Range   Squamous Epithelial / LPF RARE  RARE    WBC, UA 3-6  <3 WBC/hpf   Bacteria, UA MANY (*) RARE  URINE RAPID DRUG SCREEN (HOSP PERFORMED)     Status: Abnormal   Collection Time    08/28/12 12:24 AM      Result Value Range   Opiates NONE DETECTED  NONE DETECTED   Cocaine POSITIVE (*) NONE DETECTED   Benzodiazepines POSITIVE (*) NONE DETECTED   Amphetamines NONE DETECTED  NONE DETECTED   Tetrahydrocannabinol NONE DETECTED  NONE DETECTED   Barbiturates NONE DETECTED  NONE DETECTED   Comment:            DRUG SCREEN FOR MEDICAL PURPOSES     ONLY.  IF CONFIRMATION IS NEEDED     FOR ANY PURPOSE, NOTIFY LAB     WITHIN 5 DAYS.                LOWEST DETECTABLE LIMITS     FOR URINE DRUG SCREEN     Drug Class       Cutoff (ng/mL)     Amphetamine      1000     Barbiturate      200     Benzodiazepine   200     Tricyclics       300     Opiates          300     Cocaine          300     THC              50  URINE CULTURE     Status: None   Collection Time    08/28/12 12:24 AM      Result Value Range   Specimen Description URINE, CATHETERIZED     Special Requests NONE     Culture  Setup Time 08/28/2012 07:37     Colony Count >=100,000 COLONIES/ML     Culture ESCHERICHIA COLI     Report Status PENDING    MRSA PCR SCREENING     Status: None   Collection Time    08/28/12  4:49 AM      Result Value Range   MRSA by PCR NEGATIVE  NEGATIVE   Comment:            The GeneXpert MRSA Assay (FDA     approved for NASAL specimens     only), is one component of a     comprehensive MRSA colonization     surveillance program. It is not     intended to diagnose MRSA     infection nor to guide or     monitor treatment for     MRSA infections.  GLUCOSE, CAPILLARY     Status: Abnormal   Collection Time    08/28/12  5:20  AM      Result Value Range   Glucose-Capillary 169 (*) 70 - 99 mg/dL  GLUCOSE, CAPILLARY     Status: Abnormal   Collection Time    08/28/12  8:05 AM      Result Value Range   Glucose-Capillary 156 (*) 70 - 99 mg/dL    Comment 1 Notify RN    CBC     Status: None   Collection Time    08/28/12  8:45 AM      Result Value Range   WBC 9.1  4.0 - 10.5 K/uL   RBC 4.06  3.87 - 5.11 MIL/uL   Hemoglobin 12.3  12.0 - 15.0 g/dL   HCT 96.0  45.4 - 09.8 %   MCV 90.9  78.0 - 100.0 fL   MCH 30.3  26.0 - 34.0 pg   MCHC 33.3  30.0 - 36.0 g/dL   RDW 11.9  14.7 - 82.9 %   Platelets 261  150 - 400 K/uL  CREATININE, SERUM     Status: None   Collection Time    08/28/12  8:45 AM      Result Value Range   Creatinine, Ser 0.77  0.50 - 1.10 mg/dL   GFR calc non Af Amer >90  >90 mL/min   GFR calc Af Amer >90  >90 mL/min   Comment:            The eGFR has been calculated     using the CKD EPI equation.     This calculation has not been     validated in all clinical     situations.     eGFR's persistently     <90 mL/min signify     possible Chronic Kidney Disease.  GLUCOSE, CAPILLARY     Status: Abnormal   Collection Time    08/28/12 12:28 PM      Result Value Range   Glucose-Capillary 141 (*) 70 - 99 mg/dL   Comment 1 Notify RN    GLUCOSE, CAPILLARY     Status: Abnormal   Collection Time    08/28/12  5:19 PM      Result Value Range   Glucose-Capillary 126 (*) 70 - 99 mg/dL  GLUCOSE, CAPILLARY     Status: Abnormal   Collection Time    08/28/12  9:09 PM      Result Value Range   Glucose-Capillary 180 (*) 70 - 99 mg/dL  GLUCOSE, CAPILLARY     Status: Abnormal   Collection Time    08/29/12  8:11 AM      Result Value Range   Glucose-Capillary 143 (*) 70 - 99 mg/dL  GLUCOSE, CAPILLARY     Status: Abnormal   Collection Time    08/29/12 11:59 AM      Result Value Range   Glucose-Capillary 145 (*) 70 - 99 mg/dL    Dg Chest Port 1 View  08/28/2012   *RADIOLOGY REPORT*  Clinical Data: Altered mental status.  PORTABLE CHEST - 1 VIEW  Comparison: 09/09/2010  Findings: Low lung volumes are seen, however there is no evidence of acute infiltrate or consolidation.  No evidence of pneumothorax or pleural effusion.   Cervical spine fusion hardware noted.  IMPRESSION: Low lung volumes.  No acute findings.   Original Report Authenticated By: Myles Rosenthal, M.D.    Positive for anxiety, bad mood, behavior problems, bipolar, depression, illegal drug usage, mood swings, sleep disturbance, tobacco use and suicidal attempt with overdose of medication  and cocaine Blood pressure 112/57, pulse 90, temperature 98.5 F (36.9 C), temperature source Oral, resp. rate 18, height 5\' 5"  (1.651 m), weight 234 lb 9.1 oz (106.4 kg), SpO2 99.00%.   Assessment/Plan: Depression and suicidal attempt with benzo's Cocaine intoxication UDS is positive for cocaine and benzo's  Recommendation:  1. Recommended for acute inpatient psychiatric hospitalization for crisis stabilization when medically cleared 2. Continue sitter for safety  3. Discussed risks and benefits of lithium theraay and obtain verbal consent 4. Start Lithobid 300 mg PO BID and continue celexa 5. Appreciate psych consultation and follow as needed  Sheyli Horwitz,JANARDHAHA R. 08/29/2012, 3:05 PM

## 2012-08-30 DIAGNOSIS — F1410 Cocaine abuse, uncomplicated: Secondary | ICD-10-CM

## 2012-08-30 DIAGNOSIS — M545 Low back pain: Secondary | ICD-10-CM

## 2012-08-30 DIAGNOSIS — R45851 Suicidal ideations: Secondary | ICD-10-CM

## 2012-08-30 DIAGNOSIS — F17200 Nicotine dependence, unspecified, uncomplicated: Secondary | ICD-10-CM

## 2012-08-30 LAB — URINE CULTURE: Colony Count: >=100000

## 2012-08-30 LAB — BASIC METABOLIC PANEL
BUN: 8 mg/dL (ref 6–23)
Chloride: 105 mEq/L (ref 96–112)
Creatinine, Ser: 0.79 mg/dL (ref 0.50–1.10)
GFR calc Af Amer: >90 mL/min (ref >90)
GFR calc non Af Amer: >90 mL/min (ref >90)
Glucose, Bld: 144 mg/dL — ABNORMAL HIGH (ref 70–99)

## 2012-08-30 MED ORDER — ADULT MULTIVITAMIN W/MINERALS CH: 1.0000 | ORAL_TABLET | Freq: Every day | ORAL | Status: DC

## 2012-08-30 MED ORDER — LITHIUM CARBONATE ER 300 MG PO TBCR: 300.0000 mg | EXTENDED_RELEASE_TABLET | Freq: Two times a day (BID) | ORAL | Status: DC

## 2012-08-30 MED ORDER — KETOROLAC TROMETHAMINE 30 MG/ML IJ SOLN
30.0000 mg | Freq: Four times a day (QID) | INTRAMUSCULAR | Status: DC | PRN
  Administered 2012-08-30 – 2012-08-31 (×2): 30 mg via INTRAVENOUS
  Filled 2012-08-30 (×2): qty 1

## 2012-08-30 MED ORDER — ACETAMINOPHEN 650 MG RE SUPP: 650.0000 mg | Freq: Four times a day (QID) | RECTAL | Status: DC

## 2012-08-30 MED ORDER — NICOTINE 7 MG/24HR TD PT24
7.0000 mg | MEDICATED_PATCH | Freq: Every day | TRANSDERMAL | Status: DC
  Administered 2012-08-30 – 2012-08-31 (×2): 7 mg via TRANSDERMAL
  Filled 2012-08-30 (×2): qty 1

## 2012-08-30 MED ORDER — DIAZEPAM 2 MG PO TABS: 2.0000 mg | ORAL_TABLET | Freq: Two times a day (BID) | ORAL | Status: DC

## 2012-08-30 MED ORDER — NICOTINE 7 MG/24HR TD PT24: 1.0000 | MEDICATED_PATCH | Freq: Every day | TRANSDERMAL | Status: DC

## 2012-08-30 MED ORDER — SENNA 8.6 MG PO TABS: 1.0000 | ORAL_TABLET | Freq: Two times a day (BID) | ORAL | Status: DC

## 2012-08-30 MED ORDER — ONDANSETRON HCL 4 MG/2ML IJ SOLN: 4.0000 mg | Freq: Four times a day (QID) | INTRAMUSCULAR | Status: DC | PRN

## 2012-08-30 MED ORDER — SODIUM CHLORIDE 0.9 % IJ SOLN: 3.0000 mL | Freq: Two times a day (BID) | INTRAMUSCULAR | Status: DC

## 2012-08-30 MED ORDER — HEPARIN SODIUM (PORCINE) 5000 UNIT/ML IJ SOLN: 5000.0000 [IU] | Freq: Three times a day (TID) | INTRAMUSCULAR | Status: DC

## 2012-08-30 MED ORDER — ACETAMINOPHEN 325 MG PO TABS
650.0000 mg | ORAL_TABLET | Freq: Four times a day (QID) | ORAL | Status: DC
  Administered 2012-08-30 – 2012-08-31 (×3): 650 mg via ORAL
  Filled 2012-08-30 (×3): qty 2

## 2012-08-30 MED ORDER — KETOROLAC TROMETHAMINE 30 MG/ML IJ SOLN: 30.0000 mg | Freq: Four times a day (QID) | INTRAMUSCULAR | Status: DC | PRN

## 2012-08-30 MED ORDER — ONDANSETRON HCL 4 MG PO TABS: 4.0000 mg | ORAL_TABLET | Freq: Four times a day (QID) | ORAL | Status: DC | PRN

## 2012-08-30 MED ORDER — ACETAMINOPHEN 650 MG RE SUPP
650.0000 mg | Freq: Four times a day (QID) | RECTAL | Status: DC
  Filled 2012-08-30 (×4): qty 1

## 2012-08-30 MED ORDER — ACETAMINOPHEN 325 MG PO TABS: 650.0000 mg | ORAL_TABLET | Freq: Four times a day (QID) | ORAL | Status: DC

## 2012-08-30 NOTE — Discharge Summary (Signed)
Discharge Summary 08/31/2012 2:11 PM  [name] DOB: [birth year] MRN: [id number]  Date of Admission: 08/27/2012 Date of Discharge: 08/31/2012   PCP: Sanjuana Letters, MD Consultants: Psychiatry  Reason for Admission: Overdose in suicide attempt  Discharge Diagnosis Primary 1. Suicidal ideation and attempt Secondary 1. Bipolar disorder 2. Chronic low back pain 3. UTI 4. Non-insulin dependent Diabetes Mellitus 5. HTN 6. HLD 7. Tobacco abuse  Hospital Course: 47 y.o. with history of DM, HLD chronic low back pain and bipolar disorder who was admitted after suicidal ideation and attempt with benzodiazepine overdose and cocaine abuse.  1. Suicide attempt - [name] was admitted after suicide attempt with benzodiazepines and cocaine, with both found in her drug screen. All home sedating medications were discontinued and re-added low-dose valium to prevent withdrawal/seizure. Placed a sitter at bedside. Psychiatry was consulted and recommended psychiatric hospitalization at [facility], to where patient was transferred on day of discharge from [facility]. At time of discharge, she was no longer endorsing suicidal ideation.  2. Bipolar disorder - As an outpatient, patient was taken off lamictal and per her report, nothing else was started, however per office note 03/2012 she was prescribed low-dose seroquel. Continued home celexa this hospitalization. Psychiatry consult initiated lithobid 300mg  BID 08/29/12. Discharged patient per above to [facility]. Will likely need to monitor Cr (stable at discharge at 0.79), thyroid function, electrolytes, CBC and lithium levels. Checking urine pregnancy test (will not hold up discharge for this) although pt states she has not had sex but has also not had BTL or hysterectomy and none seen in chart. Will also need to monitor lithium level tomorrow and about every 4 days since starting Li during initial therapy.  3. Chronic low back pain - Pt takes  gabapentin and flexeril for this at home but both were held on admission until she became more alert. She was dosed scheduled tylenol and PRN tramadol for pain (did not prescribed narcotics for mechanical back pain in pt with substance abuse/depression/suicide attempt). Creatinine stable. Continue to monitor while using toradol, lithium, and lisinopril.   4. UTI - Patient with initially no symptoms but UA concerning for UTI and UCx with e coli sensitive to all but nitrofurantoin (I) and TMP-SMZ (R). Also developed dysuria. Dosed ceftriaxone IV 5/31x3 days to complete course. Dysuria resolved.  5.  Diabetes mellitus - Last A1c 03/2012 was 7. Continued home metformin.  6. HTN - Continued home lisinopril.  7. HLD - Continued home lipitor.  8. Tobacco abuse - Provided nicotine patch during hospitalization.   Discharge day physical exam: Filed Vitals:   08/31/12 0958  BP: 145/85  Pulse: 84  Temp: 97.6 F (36.4 C)  Resp: 18  General appearance: alert, cooperative and no distress  Back: symmetric, no curvature. ROM normal.  Lungs: clear to auscultation bilaterally  Heart: regular rate and rhythm, S1, S2 normal, no murmur, click, rub or gallop  Abdomen: Soft, nontender, nondistended  Extremities: extremities normal, atraumatic, no cyanosis or edema  Neuro: Normal speech and gait, no focal deficit, EOMI  Psych: Tearful, "sad." Normal rate, volume, and tone of speech. Smiles occasionally.  Procedures: None  Discharge Medications   Medication List    STOP taking these medications       cyclobenzaprine 10 MG tablet  Commonly known as:  FLEXERIL     gabapentin 300 MG capsule  Commonly known as:  NEURONTIN      TAKE these medications       acetaminophen 325  MG tablet  Commonly known as:  TYLENOL  Take 2 tablets (650 mg total) by mouth every 6 (six) hours.     acetaminophen 650 MG suppository  Commonly known as:  TYLENOL  Place 1 suppository (650 mg total) rectally every 6 (six)  hours.     atorvastatin 20 MG tablet  Commonly known as:  LIPITOR  Take 1 tablet (20 mg total) by mouth daily.     citalopram 40 MG tablet  Commonly known as:  CELEXA  Take 1 tablet (40 mg total) by mouth daily.     diazepam 2 MG tablet  Commonly known as:  VALIUM  Take 1 tablet (2 mg total) by mouth 2 (two) times daily.     heparin 5000 UNIT/ML injection  Inject 1 mL (5,000 Units total) into the skin every 8 (eight) hours.     ketorolac 30 MG/ML injection  Commonly known as:  TORADOL  Inject 1 mL (30 mg total) into the vein every 6 (six) hours as needed.     lisinopril 10 MG tablet  Commonly known as:  PRINIVIL,ZESTRIL  Take 1 tablet (10 mg total) by mouth daily.     lithium carbonate 300 MG CR tablet  Commonly known as:  LITHOBID  Take 1 tablet (300 mg total) by mouth every 12 (twelve) hours.     metFORMIN 1000 MG tablet  Commonly known as:  GLUCOPHAGE  Take 1 tablet (1,000 mg total) by mouth 2 (two) times daily with a meal.     multivitamin with minerals Tabs  Take 1 tablet by mouth daily.     nicotine 7 mg/24hr patch  Commonly known as:  NICODERM CQ - dosed in mg/24 hr  Place 1 patch onto the skin daily.     ondansetron 4 MG tablet  Commonly known as:  ZOFRAN  Take 1 tablet (4 mg total) by mouth every 6 (six) hours as needed for nausea.     ondansetron 4 MG/2ML Soln injection  Commonly known as:  ZOFRAN  Inject 2 mLs (4 mg total) into the vein every 6 (six) hours as needed for nausea.     senna 8.6 MG Tabs  Commonly known as:  SENOKOT  Take 1 tablet (8.6 mg total) by mouth 2 (two) times daily.     sodium chloride 0.9 % injection  Inject 3 mLs into the vein every 12 (twelve) hours.        Pertinent Hospital Labs: Ethyl alcohol <11 Ammonia 30 Tylenol level <15 Salicylate level <2 UA cloudy, trace Hgb, 100 glucose, positive nitrie, large leukocytes, many bacteria, rare squamous cells, 3-6 WB Urine rapid drug screen: cocaine and benzodiazepine  positiv Catheterized urine culture: >=100,000 colonies/mL e coli final  Recent Labs Lab 08/28/12 0005 08/28/12 0845  HGB 12.8 12.3  HCT 38.4 36.9  WBC 8.2 9.1  PLT 269 261   Urine pregnancy test pending   Discharge instructions: Per AVS.   Condition at discharge: stable  Disposition: Behavioral Health  Pending Tests: Urine pregnancy test  Follow up:  Follow-up Information   Follow up with Sanjuana Letters, MD. (3-5 days after discharge from [facility])    Contact information:   [address] [place] [address] [phone]       Follow up with Your psychiatrist In 5 days. (after discharge from [facility])       Follow up Issues:  - On lithium, monitor level, creatinine, and TSH - Pt should f/u with psychiatrist if she  does not already have one - F/u urine pregnancy test in patient for whom lithium started   Simone Curia MD 08/31/2012 2:11 PM PGY-1 Family Medicine Teaching Service Service Pager: [phone] Text pages welcome.  Can page through [email or web address], [facility] [facility].

## 2012-08-30 NOTE — Progress Notes (Signed)
Family Medicine Teaching Service Attending Note  I discussed patient [name]  with Dr. Benjamin Stain and reviewed their note for today.  I agree with their assessment and plan.

## 2012-08-30 NOTE — Clinical Social Work Psych Note (Signed)
Psych CSW was consulted re: suicide attempt.  Psych CSW reviewed chart.  Psychiatrist is recommending acute inpatient psychiatric treatment upon d/c.  Please consult Psych CSW once pt is medically cleared re: psych disposition.  Vickii Penna, LCSWA [phone]  Clinical Social Work

## 2012-08-30 NOTE — Progress Notes (Signed)
Chaplain following up on spiritual care consult for "suicidal thoughts." Visited earlier with pt and husband and son. This time with pt only. A sitter was present. Pt took pill overdose in attempt to take her life. Pt states she is bipolar and depressed and that she took pills because she feels no one wants her. States husband feels she is lazy and can't cook or keep house. Husband apparently does not understand her bipolar diagnosis. Husband is not threatening divorce but wanted her away from him and arranged for her to live with his parents. She feels they don't want her there. Pt has two teenage boys by her first marriage and they live with their dad in or near [place]. Pt wants to recover and knows she needs God's help. Chaplain provided emotional and spiritual support and we had prayer together.

## 2012-08-30 NOTE — Progress Notes (Signed)
Family Medicine Teaching Service Daily Progress Note Pager [phone]  Subjective: Interval History: Over weekend, patient admits to persistent suicidal ideation. She is concerned that she needs additional treatment for her bipolar depression. She is open to therapy. She is concerned that she needs additional management of her chronic low back pain. She admits to dysuria x 3 weeks. She noticed hematuria x one episode about two weeks ago. She had some flank pain. She denies fever, nausea and vomiting.  Today, doing well with no complaints and no dysuria. Understands she is going to [facility] when they have a bed available. Sitter in room.   Objective: Vital signs in last 24 hours: Temp:  [97.8 F (36.6 C)-98.6 F (37 C)] 98.5 F (36.9 C) (06/02 0455) Pulse Rate:  [76-91] 76 (06/02 0455) Resp:  [18-20] 20 (06/02 0455) BP: (112-168)/(57-87) 126/70 mmHg (06/02 0455) SpO2:  [97 %-100 %] 97 % (06/02 0455) Weight:  [252 lb 13.9 oz (114.7 kg)] 252 lb 13.9 oz (114.7 kg) (06/01 2126)  Intake/Output from previous day: 06/01 0701 - 06/02 0700 In: 2280 [P.O.:2280] Out: 5600 [Urine:5600] Intake/Output this shift:    BP 126/70  Pulse 76  Temp(Src) 98.5 F (36.9 C) (Oral)  Resp 20  Ht 5\' 5"  (1.651 m)  Wt 252 lb 13.9 oz (114.7 kg)  BMI 42.08 kg/m2  SpO2 97% General appearance: alert, cooperative and no distress  Back: symmetric, no curvature. ROM normal. Lungs: clear to auscultation bilaterally Heart: regular rate and rhythm, S1, S2 normal, no murmur, click, rub or gallop Abdomen: Soft, nontender, nondistended Extremities: extremities normal, atraumatic, no cyanosis or edema Neuro: Normal speech and gait, no focal deficit, EOMI Psych: "Ok." Normal rate, volume, and tone of speech. Smiles occasionally.  Lab Results  Component Value Date   HGBA1C 7.0 04/30/2012    Results for orders placed during the hospital encounter of 08/27/12 (from the past 24 hour(s))  GLUCOSE, CAPILLARY      Status: Abnormal   Collection Time    08/29/12  8:11 AM      Result Value Range   Glucose-Capillary 143 (*) 70 - 99 mg/dL  GLUCOSE, CAPILLARY     Status: Abnormal   Collection Time    08/29/12 11:59 AM      Result Value Range   Glucose-Capillary 145 (*) 70 - 99 mg/dL  BASIC METABOLIC PANEL     Status: Abnormal   Collection Time    08/30/12  4:15 AM      Result Value Range   Sodium 137  135 - 145 mEq/L   Potassium 3.4 (*) 3.5 - 5.1 mEq/L   Chloride 105  96 - 112 mEq/L   CO2 25  19 - 32 mEq/L   Glucose, Bld 144 (*) 70 - 99 mg/dL   BUN 8  6 - 23 mg/dL   Creatinine, Ser 4.54  0.50 - 1.10 mg/dL   Calcium 8.6  8.4 - 09.8 mg/dL   GFR calc non Af Amer >90  >90 mL/min   GFR calc Af Amer >90  >90 mL/min   Studies/Results: Dg Chest Port 1 View  08/28/2012   *RADIOLOGY REPORT*  Clinical Data: Altered mental status.  PORTABLE CHEST - 1 VIEW  Comparison: 09/09/2010  Findings: Low lung volumes are seen, however there is no evidence of acute infiltrate or consolidation.  No evidence of pneumothorax or pleural effusion.  Cervical spine fusion hardware noted.  IMPRESSION: Low lung volumes.  No acute findings.   Original Report Authenticated By:  Myles Rosenthal, M.D.    Urine culture with >100,000 e coli that is sensitive to all except resistant to TMP-SMZ and intermediate to Nitrofurantoin  Scheduled Meds: reviewed all scheduled and prn medications.   Assessment/Plan: 47 y.o. with suicidal ideation and attempt with benzo and cocaine abuse.  1. Suicidal ideation and attempt: A: Persistent SI. Sitter at bedside. No evidence of BZ withdrawal currently. Patient was seen by Dr. Elsie Saas 5/31 who recommends inpatient psych. Currently medically stable. Limitations are psych bed availability and she is starting IV antibiotic therapy for UTI but empiric PO therapy is just as reasonable or continue CTX IV x 2 more days at [facility] if possible. P:  - valium at 2 mg BID to prevent BZ withdrawal -  Await psych placement - will call psych social worker to follow-up today. - Psych consult recommended lithobid 300mg  BID and continue celexa - With psych and substance hx, avoid opioids. For back pain, will schedule tylenol and toradol but monitor Cr while using lithium. Currently stable.  2. UTI:  A: UA consistent with UTI and patient symptomatic yesterday, asymptomatic today after initiating rocephin. P: rocephin started 6/1 pending urine culture. If patient has a psych bed and urine culture is still pending this can be changed to keflex or continue 2 more d ceftriaxone if possible at [facility]  3. NIDDM: CBGs at goal. Continue metformin.  4. Smoking hx: Pt requesting nicotine patch - Ordered nicotine patch  5. DVT PPX: heparin.  6. Code: Full.   7. Dispo: to in patient psych. Awaiting bed.     LOS: 3 days   Simone Curia 7:21 AM, 08/29/2012 Service pager [phone] - Feel free to text page.

## 2012-08-31 ENCOUNTER — Inpatient Hospital Stay (HOSPITAL_COMMUNITY)
Admission: AD | Admit: 2012-08-31 | Discharge: 2012-09-03 | DRG: 885 | Disposition: A | Discharge status: Home / Self Care | Payer: MEDICAID | Source: Intra-hospital | Attending: Psychiatry | Admitting: Psychiatry

## 2012-08-31 ENCOUNTER — Encounter (HOSPITAL_COMMUNITY): Payer: Self-pay | Admitting: Behavioral Health

## 2012-08-31 DIAGNOSIS — F1410 Cocaine abuse, uncomplicated: Secondary | ICD-10-CM | POA: Diagnosis present

## 2012-08-31 DIAGNOSIS — E7800 Pure hypercholesterolemia, unspecified: Secondary | ICD-10-CM | POA: Diagnosis present

## 2012-08-31 DIAGNOSIS — Z79899 Other long term (current) drug therapy: Secondary | ICD-10-CM

## 2012-08-31 DIAGNOSIS — T6592XA Toxic effect of unspecified substance, intentional self-harm, initial encounter: Secondary | ICD-10-CM

## 2012-08-31 DIAGNOSIS — F319 Bipolar disorder, unspecified: Principal | ICD-10-CM | POA: Diagnosis present

## 2012-08-31 DIAGNOSIS — I10 Essential (primary) hypertension: Secondary | ICD-10-CM

## 2012-08-31 DIAGNOSIS — E119 Type 2 diabetes mellitus without complications: Secondary | ICD-10-CM | POA: Diagnosis present

## 2012-08-31 DIAGNOSIS — F1414 Cocaine abuse with cocaine-induced mood disorder: Secondary | ICD-10-CM

## 2012-08-31 DIAGNOSIS — E1169 Type 2 diabetes mellitus with other specified complication: Secondary | ICD-10-CM

## 2012-08-31 LAB — GLUCOSE, CAPILLARY: Glucose-Capillary: 154 mg/dL — ABNORMAL HIGH (ref 70–99)

## 2012-08-31 MED ORDER — POTASSIUM CHLORIDE CRYS ER 20 MEQ PO TBCR
20.0000 meq | EXTENDED_RELEASE_TABLET | Freq: Two times a day (BID) | ORAL | Status: AC
  Administered 2012-08-31 – 2012-09-02 (×4): 20 meq via ORAL
  Filled 2012-08-31 (×5): qty 1

## 2012-08-31 MED ORDER — INSULIN ASPART 100 UNIT/ML ~~LOC~~ SOLN
0.0000 [IU] | Freq: Every day | SUBCUTANEOUS | Status: DC
Start: 2012-08-31 — End: 2012-09-03

## 2012-08-31 MED ORDER — LITHIUM CARBONATE ER 300 MG PO TBCR
300.0000 mg | EXTENDED_RELEASE_TABLET | Freq: Two times a day (BID) | ORAL | Status: DC
  Administered 2012-08-31 – 2012-09-03 (×6): 300 mg via ORAL
  Filled 2012-08-31 (×11): qty 1

## 2012-08-31 MED ORDER — TRAZODONE HCL 50 MG PO TABS
50.0000 mg | ORAL_TABLET | Freq: Every evening | ORAL | Status: DC | PRN
  Administered 2012-08-31 – 2012-09-02 (×5): 50 mg via ORAL
  Filled 2012-08-31 (×2): qty 1
  Filled 2012-08-31: qty 6
  Filled 2012-08-31 (×3): qty 1
  Filled 2012-08-31: qty 6
  Filled 2012-08-31 (×4): qty 1

## 2012-08-31 MED ORDER — POTASSIUM CHLORIDE CRYS ER 20 MEQ PO TBCR
20.0000 meq | EXTENDED_RELEASE_TABLET | Freq: Once | ORAL | Status: AC
  Administered 2012-08-31: 20 meq via ORAL
  Filled 2012-08-31: qty 1

## 2012-08-31 MED ORDER — ACETAMINOPHEN 325 MG PO TABS
650.0000 mg | ORAL_TABLET | Freq: Four times a day (QID) | ORAL | Status: DC | PRN
  Administered 2012-08-31 – 2012-09-03 (×4): 650 mg via ORAL

## 2012-08-31 MED ORDER — ACETAMINOPHEN 325 MG PO TABS: 650.0000 mg | ORAL_TABLET | Freq: Four times a day (QID) | ORAL | Status: DC

## 2012-08-31 MED ORDER — ALUM & MAG HYDROXIDE-SIMETH 200-200-20 MG/5ML PO SUSP: 30.0000 mL | ORAL | Status: DC | PRN

## 2012-08-31 MED ORDER — METFORMIN HCL 500 MG PO TABS
1000.0000 mg | ORAL_TABLET | Freq: Two times a day (BID) | ORAL | Status: DC
Start: 2012-09-01 — End: 2012-09-03
  Administered 2012-09-01 – 2012-09-03 (×5): 1000 mg via ORAL
  Filled 2012-08-31 (×9): qty 2

## 2012-08-31 MED ORDER — ATORVASTATIN CALCIUM 20 MG PO TABS
20.0000 mg | ORAL_TABLET | Freq: Every day | ORAL | Status: DC
  Administered 2012-09-01 – 2012-09-03 (×3): 20 mg via ORAL
  Filled 2012-08-31 (×7): qty 1

## 2012-08-31 MED ORDER — INSULIN ASPART 100 UNIT/ML ~~LOC~~ SOLN
0.0000 [IU] | Freq: Three times a day (TID) | SUBCUTANEOUS | Status: DC
  Administered 2012-09-01 (×2): 2 [IU] via SUBCUTANEOUS
  Administered 2012-09-02: 5 [IU] via SUBCUTANEOUS
  Administered 2012-09-02 – 2012-09-03 (×4): 2 [IU] via SUBCUTANEOUS

## 2012-08-31 MED ORDER — ONDANSETRON HCL 4 MG PO TABS: 4.0000 mg | ORAL_TABLET | Freq: Four times a day (QID) | ORAL | Status: DC | PRN

## 2012-08-31 MED ORDER — CITALOPRAM HYDROBROMIDE 40 MG PO TABS
40.0000 mg | ORAL_TABLET | Freq: Every day | ORAL | Status: DC
  Administered 2012-09-01 – 2012-09-03 (×3): 40 mg via ORAL
  Filled 2012-08-31 (×3): qty 1
  Filled 2012-08-31: qty 3
  Filled 2012-08-31 (×2): qty 1

## 2012-08-31 MED ORDER — CHLORDIAZEPOXIDE HCL 25 MG PO CAPS: 25.0000 mg | ORAL_CAPSULE | Freq: Four times a day (QID) | ORAL | Status: DC | PRN

## 2012-08-31 MED ORDER — MAGNESIUM HYDROXIDE 400 MG/5ML PO SUSP: 30.0000 mL | Freq: Every day | ORAL | Status: DC | PRN

## 2012-08-31 MED ORDER — LISINOPRIL 10 MG PO TABS
10.0000 mg | ORAL_TABLET | Freq: Every day | ORAL | Status: DC
  Administered 2012-09-01 – 2012-09-03 (×3): 10 mg via ORAL
  Filled 2012-08-31 (×6): qty 1

## 2012-08-31 MED ORDER — NICOTINE 14 MG/24HR TD PT24
14.0000 mg | MEDICATED_PATCH | Freq: Every day | TRANSDERMAL | Status: DC
  Administered 2012-09-01 – 2012-09-02 (×2): 14 mg via TRANSDERMAL
  Filled 2012-08-31 (×5): qty 1

## 2012-08-31 NOTE — Tx Team (Signed)
Initial Interdisciplinary Treatment Plan  PATIENT STRENGTHS: (choose at least two) Ability for insight Capable of independent living  PATIENT STRESSORS: Financial difficulties Substance abuse   PROBLEM LIST: Problem List/Patient Goals Date to be addressed Date deferred Reason deferred Estimated date of resolution  Depression 08/31/12     substance abuse 08/31/12     SI 08/31/12                                          DISCHARGE CRITERIA:  Ability to meet basic life and health needs Adequate post-discharge living arrangements Improved stabilization in mood, thinking, and/or behavior Verbal commitment to aftercare and medication compliance Withdrawal symptoms are absent or subacute and managed without 24-hour nursing intervention  PRELIMINARY DISCHARGE PLAN: Attend aftercare/continuing care group Attend PHP/IOP  PATIENT/FAMIILY INVOLVEMENT: This treatment plan has been presented to and reviewed with the patient, [name], and/or family member. The patient and family have been given the opportunity to ask questions and make suggestions.  [name] L 08/31/2012, 6:54 PM

## 2012-08-31 NOTE — Discharge Summary (Signed)
I have reviewed this discharge summary and agree.    

## 2012-08-31 NOTE — Clinical Social Work Psych Note (Addendum)
CSW had 1st opinion complete and faxed to [facility] for review.  [facility] approved the IVC paperwork and is ready for pt to be admitted.  Pt has been accepted by [place] [id number].    RN to call report [phone].  Psych CSW called [employer] ([phone])  for transportation.    Vickii Penna, LCSWA [phone]  Clinical Social Work

## 2012-08-31 NOTE — Progress Notes (Signed)
Report called to Maureen Ralphs, Charity fundraiser at [facility].  All questions have been answered to the receiving nurse's satisfaction.  Patient is alert and oriented, ambulatory.  Patient is involuntarily committed, but is willing to go. [employer] to transport patient.

## 2012-08-31 NOTE — Progress Notes (Signed)
Patient ID: [name], female   DOB: 1966-03-08, 47 y.o.   MRN: [id number]  D: Pt denies SI/HI/AVH. Pt is pleasant and cooperative. Pt states "I'm here because I overdosed". Pt states she has chronic lower back pain that always hurts. Pt states back pain 8 out of 10  A: Pt was offered support and encouragement. Pt was given scheduled medications. Pt was encourage to attend groups. Q 15 minute checks were done for safety. Pt given tylenol for back pain.   R:Pt attends groups and interacts well with peers and staff. Pt is taking medication. Pt has no complaints at this time.Pt receptive to treatment and safety maintained on unit.

## 2012-08-31 NOTE — Progress Notes (Signed)
Admission note: Pt reports feeling depressed d/t being unemployed and homeless. Pt is currently living with her mother in law who doesn't want her living there. Per pt, this was her first suicide attempt d/t an increase in depression and feeling like there was no way out. Pt reports suffering from social anxiety and fears attending group with others while here for treatment. Pt reports that her sister passed away unintentionally d/t overdosing on her meds. Prior to her sister's death, her sister attempted suicide five times. Pt denies alcohol use but admits that she uses cocaine occasionally. Pt has been using cocaine since age 47 off and on over the years. Pt denies SI/HI/AVH at this time. Pt COWS score is 4.  Pt assessed, searched, CBG 112 @ 1700, v/s taken and pt explained the policy here at [facility].

## 2012-08-31 NOTE — Clinical Social Work Psych Assess (Signed)
Clinical Social Work Department CLINICAL SOCIAL WORK PSYCHIATRY SERVICE LINE ASSESSMENT 08/31/2012  Patient:  [name]  Account:  [id number]  Admit Date:  08/27/2012  Clinical Social Worker:  Read Drivers  Date/Time:  08/31/2012 02:46 PM Referred by:  Physician  Date referred:  08/31/2012 Reason for Referral  Crisis Intervention  Behavioral Health Issues   Presenting Symptoms/Problems (In the person's/family's own words):   Psych was consulted due to an overdose attempt with prescription drugs and depression.   Abuse/Neglect/Trauma History (check all that apply)  Denies history   Abuse/Neglect/Trauma Comments:   none   Psychiatric History (check all that apply)  Denies history   Psychiatric medications:  lithium carbonate 300 MG CR tablet  Commonly known as:  LITHOBID  Take 1 tablet (300 mg total) by mouth every 12 (twelve) hours.    citalopram 40 MG tablet  Commonly known as:  CELEXA  Take 1 tablet (40 mg total) by mouth daily.    diazepam 2 MG tablet  Commonly known as:  VALIUM  Take 1 tablet (2 mg total) by mouth 2 (two) times daily.   Current Mental Health Hospitalizations/Previous Mental Health History:   pt denies hx or current MH tx including IP and OP   Current provider:   pt reports that MD Leveda Anna is her primary care physician. She denies any current or hx MH tx.   Place and Date:   ongoing   Current Medications:   acetaminophen 325 MG tablet  Commonly known as:  TYLENOL  Take 2 tablets (650 mg total) by mouth every 6 (six) hours.        acetaminophen 650 MG suppository  Commonly known as:  TYLENOL  Place 1 suppository (650 mg total) rectally every 6 (six) hours.        atorvastatin 20 MG tablet  Commonly known as:  LIPITOR  Take 1 tablet (20 mg total) by mouth daily.        citalopram 40 MG tablet  Commonly known as:  CELEXA  Take 1 tablet (40 mg total) by mouth daily.        diazepam 2 MG tablet  Commonly known as:  VALIUM  Take  1 tablet (2 mg total) by mouth 2 (two) times daily.        heparin 5000 UNIT/ML injection  Inject 1 mL (5,000 Units total) into the skin every 8 (eight) hours.        ketorolac 30 MG/ML injection  Commonly known as:  TORADOL  Inject 1 mL (30 mg total) into the vein every 6 (six) hours as needed.        lisinopril 10 MG tablet  Commonly known as:  PRINIVIL,ZESTRIL  Take 1 tablet (10 mg total) by mouth daily.        lithium carbonate 300 MG CR tablet  Commonly known as:  LITHOBID  Take 1 tablet (300 mg total) by mouth every 12 (twelve) hours.        metFORMIN 1000 MG tablet  Commonly known as:  GLUCOPHAGE  Take 1 tablet (1,000 mg total) by mouth 2 (two) times daily with a meal.        multivitamin with minerals Tabs  Take 1 tablet by mouth daily.        nicotine 7 mg/24hr patch  Commonly known as:  NICODERM CQ - dosed in mg/24 hr  Place 1 patch onto the skin daily.        ondansetron 4 MG tablet  Commonly known as:  ZOFRAN  Take 1 tablet (4 mg total) by mouth every 6 (six) hours as needed for nausea.        ondansetron 4 MG/2ML Soln injection  Commonly known as:  ZOFRAN  Inject 2 mLs (4 mg total) into the vein every 6 (six) hours as needed for nausea.        senna 8.6 MG Tabs  Commonly known as:  SENOKOT  Take 1 tablet (8.6 mg total) by mouth 2 (two) times daily.        sodium chloride 0.9 % injection  Inject 3 mLs into the vein every 12 (twelve) hours.   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   Emotional Health / Current Symptoms    Suicide/Self Harm  Suicidal ideation (ex: "I can't take any more,I wish I could disappear")  Suicide attempt in past (date/description)  Has a plan for suicide   Suicide attempt in the past:   05/30 pt ingested an undisclosed amount of prescription medication   Other harmful behavior:   none reported or charted    Psychotic/Dissociative Symptoms  None reported   Other Psychotic/Dissociative Symptoms:   none reported or charted    Attention/Behavioral Symptoms  Within Normal Limits   Other Attention / Behavioral Symptoms:   none reported or charted    Cognitive Impairment  Orientation - Place  Orientation - Self  Orientation - Situation  Orientation - Time  Poor Judgement  Poor/Impaired Decision-Making   Other Cognitive Impairment:   no other reported or charted    Mood and Adjustment  Anxious  DEPRESSION    Stress, Anxiety, Trauma, Any Recent Loss/Stressor  Anxiety   Anxiety (frequency):   pt reports having social anxiety   Phobia (specify):   none reported or charted   Compulsive behavior (specify):   none reported or charted   Obsessive behavior (specify):   none reported or charted   Other:   none reported or charted other than that listed above   Substance Abuse/Use  History of substance use  Current substance use   SBIRT completed (please refer for detailed history):  N  Self-reported substance use:   pt reports using cocaine prior to admission and tested positive for both cocaine and Benzos per UDS   Urinary Drug Screen Completed:  Y Alcohol level:   BAL >11    Environmental/Housing/Living Arrangement  With Family Member   Who is in the home:   husband   Emergency contact:  husband Kendell Bane, [phone]   Financial  Medicaid   Patient's Strengths and Goals (patient's own words):   Pt is complaint with medical advice.  Pt acknowleges need for help.   Clinical Social Worker's Interpretive Summary:   Psych CSW assessed pt at bedside.  Pt was on the phone. Psych CSW requested pt end the phone conversation so Psych CSW could communication with pt.  Pt was agreeable.  Pt stated to Psych CSW that she was voluntary.  Psych CSW reviewed chart to find that pt was, in fact, IVC by her husband.  This information was faxed to [facility] for review.  Pt reports  having a hard time dealing with life in general. Pt does not report a specific trigger or any specific trauma.  Pt reports she does not feel safe by herself.  Pt reports that she feels like she needs to be more "mentally stable".  Pt states  that if she gets "that down again" that she will try to OD again.  Pt states that the MD and psychiatrist both suggest inpatient tx upon d/c.  Pt is agreeable to this recommendation.  Pt states she has social anxiety.  Psych CSW asked pt to address this with [facility] once she is admitted.   Disposition:  Inpatient referral made [facility], [facility], Geri-psych)  Vickii Penna, [place] [phone]  Clinical Social Work

## 2012-08-31 NOTE — Progress Notes (Signed)
Family Medicine Teaching Service Attending Note  I interviewed and examined patient [name] and reviewed their tests and x-rays.  I discussed with Dr. Benjamin Stain and reviewed their note for today.  I agree with their assessment and plan.     Additionally  Medically stable

## 2012-08-31 NOTE — BH Assessment (Signed)
Assessment Note   [name] is an 47 y.o. female. Pt on Med floor at [name]. Brought to [name] ED on 08/27/12 via EMS after she was found unresponsive in her vehicle. Urine drug screen was positive for cocaine and benzodiazapine. Admits to taking gabapentin, flexeril, and valium in attempt to end her life. She reports she has "a whole lot of things going on giving her reasons." She endorses cocaine use 2 nights prior. She reports she does not take any other recreational drugs or drink alcohol. Admits the overdose was "stupid." Has a history of Bipolar disorder and taken off Lamictal, per patient and not started on anything else.  Per note from Chaplain: Pt states she is bipolar and depressed and that she took pills because she feels no one wants her. States husband feels she is lazy and can't cook or keep house. Husband apparently does not understand her bipolar diagnosis. Husband is not threatening divorce but wanted her away from him and arranged for her to live with his parents. She feels they don't want her there. Pt has two teenage boys by her first marriage and they live with their dad in or near [name]. Pt accepted by [facility] to Dr. Dub Mikes 306-2.   Axis I: 296.33 Major Depression Single Episode severe without psychosis Axis II: No diagnosis Axis III:  Past Medical History  Diagnosis Date  . Diabetes mellitus   . High cholesterol   . Depression   . Bipolar 1 disorder    Axis IV: problems related to social environment and problems with primary support group Axis V: 21-30 behavior considerably influenced by delusions or hallucinations OR serious impairment in judgment, communication OR inability to function in almost all areas  Past Medical History:  Past Medical History  Diagnosis Date  . Diabetes mellitus   . High cholesterol   . Depression   . Bipolar 1 disorder     Past Surgical History  Procedure Laterality Date  . Arm surgery    . Knee surgery    . Appendectomy     . Cholecystectomy    . Cesarean section    . Neck surgery      Family History: No family history on file.  Social History:  reports that she has been smoking Cigarettes.  She has been smoking about 1.00 pack per day. She does not have any smokeless tobacco history on file. She reports that she does not drink alcohol or use illicit drugs.  Additional Social History:     CIWA:   COWS:    Allergies:  Allergies  Allergen Reactions  . Methadone Hcl Swelling  . Morphine And Related Itching  . Propranolol Nausea Only    Home Medications:  Medications Prior to Admission  Medication Sig Dispense Refill  . atorvastatin (LIPITOR) 20 MG tablet Take 1 tablet (20 mg total) by mouth daily.  30 tablet  12  . citalopram (CELEXA) 40 MG tablet Take 1 tablet (40 mg total) by mouth daily.  30 tablet  12  . lisinopril (PRINIVIL,ZESTRIL) 10 MG tablet Take 1 tablet (10 mg total) by mouth daily.  30 tablet  11  . metFORMIN (GLUCOPHAGE) 1000 MG tablet Take 1 tablet (1,000 mg total) by mouth 2 (two) times daily with a meal.  180 tablet  3  . [DISCONTINUED] cyclobenzaprine (FLEXERIL) 10 MG tablet Take 1 tablet (10 mg total) by mouth at bedtime as needed.  90 tablet  6  . [DISCONTINUED] diazepam (VALIUM) 5  MG tablet Take 1 tablet (5 mg total) by mouth every 12 (twelve) hours as needed for anxiety.  60 tablet  3  . [DISCONTINUED] gabapentin (NEURONTIN) 300 MG capsule Take 1 capsule (300 mg total) by mouth 3 (three) times daily.  90 capsule  3    OB/GYN Status:  No LMP recorded.  General Assessment Data Location of Assessment: [facility] Assessment Services Living Arrangements: Spouse/significant other;Other relatives (lives with husband in mother-in-laws home) Can pt return to current living arrangement?: Yes Admission Status: Voluntary Is patient capable of signing voluntary admission?: Yes Transfer from: Acute Hospital Referral Source: Medical Floor Inpatient  Education Status Is patient currently in  school?: No  Risk to self Suicidal Ideation: Yes-Currently Present Suicidal Intent: Yes-Currently Present Is patient at risk for suicide?: Yes Suicidal Plan?: Yes-Currently Present Specify Current Suicidal Plan:  (Took OD ) Access to Means: Yes Specify Access to Suicidal Means: Medications What has been your use of drugs/alcohol within the last 12 months?: UDS positive cocaine,benzos Previous Attempts/Gestures: No Intentional Self Injurious Behavior: None Family Suicide History: Unknown Recent stressful life event(s): Conflict (Comment);Other (Comment) Persecutory voices/beliefs?: No Depression: Yes Depression Symptoms: Feeling worthless/self pity Substance abuse history and/or treatment for substance abuse?: No Suicide prevention information given to non-admitted patients: Not applicable  Risk to Others Homicidal Ideation: No Thoughts of Harm to Others: No Current Homicidal Intent: No Current Homicidal Plan: No Access to Homicidal Means: No History of harm to others?: No Assessment of Violence: None Noted Does patient have access to weapons?: No Criminal Charges Pending?: No Does patient have a court date: No  Psychosis Hallucinations: None noted Delusions: None noted  Mental Status Report Appear/Hygiene: Other (Comment) (WNL) Eye Contact: Fair Motor Activity: Other (Comment) (Decreased) Speech: Soft Level of Consciousness: Alert Mood: Depressed Affect: Other (Comment) (flat) Anxiety Level: None Thought Processes: Coherent;Relevant Judgement: Impaired Orientation: Person;Place;Time;Situation Obsessive Compulsive Thoughts/Behaviors: None  Cognitive Functioning Concentration: Decreased Memory: Recent Intact;Remote Intact IQ: Average Insight: Poor Impulse Control: Poor Appetite: Fair Sleep: No Change Vegetative Symptoms: None  ADLScreening [facility] Assessment Services) Patient's cognitive ability adequate to safely complete daily activities?: Yes Patient able  to express need for assistance with ADLs?: Yes Independently performs ADLs?: Yes (appropriate for developmental age)  Abuse/Neglect [facility] Physical Abuse: Denies Verbal Abuse: Yes, present (Comment) Sexual Abuse: Denies  Prior Inpatient Therapy Prior Inpatient Therapy: No  Prior Outpatient Therapy Prior Outpatient Therapy: No  ADL Screening (condition at time of admission) Patient's cognitive ability adequate to safely complete daily activities?: Yes Patient able to express need for assistance with ADLs?: Yes Independently performs ADLs?: Yes (appropriate for developmental age) Weakness of Legs: None Weakness of Arms/Hands: None  Home Assistive Devices/Equipment Home Assistive Devices/Equipment: None  Therapy Consults (therapy consults require a physician order) PT Evaluation Needed: No OT Evalulation Needed: No Abuse/Neglect Assessment (Assessment to be complete while patient is alone) Physical Abuse: Denies Verbal Abuse: Yes, present (Comment) Sexual Abuse: Denies Exploitation of patient/patient's resources: Denies Self-Neglect: Denies          Additional Information 1:1 In Past 12 Months?: No CIRT Risk: No Elopement Risk: No Does patient have medical clearance?: Yes     Disposition:  Disposition Initial Assessment Completed for this Encounter: Yes Disposition of Patient: Inpatient treatment program Type of inpatient treatment program: Adult  On Site Evaluation by:   Reviewed with Physician:     Leafy Kindle 08/31/2012 12:29 PM

## 2012-08-31 NOTE — Progress Notes (Signed)
Family Medicine Teaching Service Daily Progress Note Pager [phone]  Subjective: Interval History: Pt with continued SI over weekend, currently not endorsing SI/HI. She is concerned that she may need more treatment for bipolar disorder and is open to therapy. She wants more medication for chronic low back pain.   Today, doing well other than being very anxious about behavioral health since she has never been before. Denies dysuria or pain anywhere. Denies SI/HI but reports feeling of depression regarding her husband not understanding what she is going through.  Objective: Vital signs in last 24 hours: Temp:  [97.6 F (36.4 C)-98.8 F (37.1 C)] 97.6 F (36.4 C) (06/03 0958) Pulse Rate:  [69-84] 84 (06/03 0958) Resp:  [18] 18 (06/03 0958) BP: (132-149)/(80-91) 145/85 mmHg (06/03 0958) SpO2:  [96 %-99 %] 96 % (06/03 0958) Weight:  [249 lb 12.5 oz (113.3 kg)] 249 lb 12.5 oz (113.3 kg) (06/02 2126)  Intake/Output from previous day: 06/02 0701 - 06/03 0700 In: 1080 [P.O.:1080] Out: 3150 [Urine:3150] Intake/Output this shift: Total I/O In: 240 [P.O.:240] Out: -   BP 145/85  Pulse 84  Temp(Src) 97.6 F (36.4 C) (Oral)  Resp 18  Ht 5\' 5"  (1.651 m)  Wt 249 lb 12.5 oz (113.3 kg)  BMI 41.57 kg/m2  SpO2 96% General appearance: alert, cooperative and no distress  Back: symmetric, no curvature. ROM normal. Lungs: clear to auscultation bilaterally Heart: regular rate and rhythm, S1, S2 normal, no murmur, click, rub or gallop Abdomen: Soft, nontender, nondistended Extremities: extremities normal, atraumatic, no cyanosis or edema Neuro: Normal speech and gait, no focal deficit, EOMI Psych: Tearful, "sad." Normal rate, volume, and tone of speech. Smiles occasionally.  Lab Results  Component Value Date   HGBA1C 7.0 04/30/2012    No results found for this or any previous visit (from the past 24 hour(s)). Studies/Results: Dg Chest Port 1 View  08/28/2012   *RADIOLOGY REPORT*   Clinical Data: Altered mental status.  PORTABLE CHEST - 1 VIEW  Comparison: 09/09/2010  Findings: Low lung volumes are seen, however there is no evidence of acute infiltrate or consolidation.  No evidence of pneumothorax or pleural effusion.  Cervical spine fusion hardware noted.  IMPRESSION: Low lung volumes.  No acute findings.   Original Report Authenticated By: Myles Rosenthal, M.D.    Urine culture with >100,000 e coli that is sensitive to all except resistant to TMP-SMZ and intermediate to Nitrofurantoin  Scheduled Meds: reviewed all scheduled and prn medications.   Assessment/Plan: 47 y.o. with suicidal ideation and attempt with benzo and cocaine abuse.  1. Suicidal ideation and attempt: A: Has had SI this visit, none since yesterday. Sitter at bedside. No evidence of BZ withdrawal currently. Patient was seen by Dr. Elsie Saas 5/31 who recommends inpatient psych. Currently medically stable.  P:  - valium at 2 mg BID to prevent BZ withdrawal - Behavioral Health bed available after 3 pm per Psychiatry social worker - Psych consult recommended lithobid 300mg  BID and continue celexa - With psych and substance hx, avoid opioids. For back pain, will schedule tylenol and toradol but monitor Cr while using lithium. Currently stable.  2. UTI:  A: UA consistent with UTI and patient initially symptomatic, receiving third dose of IV ceftriaxone today to complete treatment, currently asymptomatic P: rocephin started 6/1 pending urine culture, receiving third/final dose today.  3. NIDDM: CBGs at goal. Continue metformin.  4. Smoking hx: Nicotine patch  - Ordered nicotine patch  5. DVT PPX: heparin.  6. Code: Full.  7. Dispo: to in patient psych today    LOS: 4 days   Simone Curia 12:48 PM, 08/31/2012  Family Practice Service Pager: [phone] Text pages welcome.  Can page through [email or web address], [place] [facility].

## 2012-09-01 DIAGNOSIS — F1410 Cocaine abuse, uncomplicated: Secondary | ICD-10-CM

## 2012-09-01 DIAGNOSIS — F319 Bipolar disorder, unspecified: Secondary | ICD-10-CM

## 2012-09-01 LAB — SALICYLATE LEVEL: Salicylate Lvl: <2 mg/dL — ABNORMAL LOW (ref 2.8–20.0)

## 2012-09-01 LAB — HEPATIC FUNCTION PANEL
Albumin: 3.6 g/dL (ref 3.5–5.2)
Total Protein: 7.1 g/dL (ref 6.0–8.3)

## 2012-09-01 LAB — HEMOGLOBIN A1C
Hgb A1c MFr Bld: 6.6 % — ABNORMAL HIGH (ref <5.7)
Mean Plasma Glucose: 143 mg/dL — ABNORMAL HIGH (ref <117)

## 2012-09-01 LAB — ACETAMINOPHEN LEVEL: Acetaminophen (Tylenol), Serum: <15 ug/mL (ref 10–30)

## 2012-09-01 NOTE — [facility] Group Notes (Signed)
[facility] LCSW Group Therapy  09/01/2012  1:15 PM   Type of Therapy:  Group Therapy  Participation Level:  Active  Participation Quality:  Appropriate and Attentive  Affect:  Appropriate  Cognitive:  Alert and Appropriate  Insight:  Developing/Improving and Engaged  Engagement in Therapy:  Developing/Improving and Engaged  Modes of Intervention:  Clarification, Confrontation, Discussion, Education, Exploration, Limit-setting, Orientation, Problem-solving, Rapport Building, Dance movement psychotherapist, Socialization and Support  Summary of Progress/Problems: The topic for group today was emotional regulation.  Pt participated in the discussion regarding what emotional regulation is and how it affects their life, positive and negative.  Pt discussed coping skills and ways they can regulate their emotions in a positive manner.   Pt was able to process negative consequences she's experienced by handling situations in a negative manner.  Pt shared that a positive coping skill she has developed is writing.  Pt explained that she writes a letter to release any emotions before talking with that person.  Pt actively participated in group discussion.    Reyes Ivan, LCSWA 09/01/2012 2:06 PM
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
## 2012-09-01 NOTE — [facility] Group Notes (Signed)
[facility] LCSW Aftercare Discharge Planning Group Note   09/01/2012 8:45 AM  Participation Quality:  Alert and Appropriate   Mood/Affect:  Appropriate  Depression Rating:  2-3  Anxiety Rating:  4-5  Thoughts of Suicide:  Pt denies SI/HI  Will you contract for safety?   Yes  Current AVH:  No  Plan for Discharge/Comments:  Pt attended discharge planning group and actively participated in group.  CSW provided pt with today's workbook.  Pt states that she was IVC due to a suicide attempt by overdose.  Pt states that she was "sick and tired of being sick and tired".  Pt states that her stressors are having no job, no income and housing issues.  Pt states that she is applying for disability.  Pt reports being married but living with family in [place].  Pt is considering returning to home in [place] or a friend in [place].  Pt wants outpatient services; CSW will assess for appropriate referrals.  No further needs voiced by pt at this time.    Transportation Means: Pt states that she doesn't have transportation but friends or family will pick her up upon d/c  Supports: Pt states that she has limited support due to no one visiting her while here.    Reyes Ivan, LCSWA 09/01/2012 10:08 AM

## 2012-09-01 NOTE — Progress Notes (Signed)
Patient ID: [name], female   DOB: 19-Oct-1965, 47 y.o.   MRN: [id number] She has been up and about and to groups interacting with peers and staff. She has  C/o heparin  injection site left lower abdomen area has a large purple discolored area. NP Aggie aware.
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
## 2012-09-01 NOTE — Tx Team (Signed)
Interdisciplinary Treatment Plan Update (Adult)  Date: 09/01/2012  Time Reviewed:  9:45 AM  Progress in Treatment: Attending groups: Yes Participating in groups:  Yes Taking medication as prescribed:  Yes Tolerating medication:  Yes Family/Significant othe contact made: CSW assessing Patient understands diagnosis:  Yes Discussing patient identified problems/goals with staff:  Yes Medical problems stabilized or resolved:  Yes Denies suicidal/homicidal ideation: Yes Issues/concerns per patient self-inventory:  Yes Other:  New problem(s) identified: N/A  Discharge Plan or Barriers: CSW assessing for appropriate referrals.    Reason for Continuation of Hospitalization: Anxiety Depression Medication Stabilization  Comments: N/A  Estimated length of stay: 3-5 days  For review of initial/current patient goals, please see plan of care.  Attendees: Patient:     Family:     Physician:   09/01/2012 8:24 AM   Nursing:   Donna Shimp, RN 09/01/2012 8:24 AM   Clinical Social Worker:  Adrien Dietzman Horton, LCSWA 09/01/2012 8:24 AM   Other: Vivian Kent, RN 09/01/2012 8:24 AM   Other:  Catherine Harrill, LCSWA 09/01/2012 8:24 AM   Other:  Aggie Nwoko, NP 09/01/2012 8:24 AM  Other:  Delora Sutton, care coordinator 09/01/2012 8:24 AM  Other:    Other:    Other:    Other:    Other:    Other:     Scribe for Treatment Team:   Horton, Kartik Fernando Nicole, 09/01/2012 , 8:24 AM     

## 2012-09-01 NOTE — Progress Notes (Signed)
Adult Psychoeducational Group Note  Date:  09/01/2012 Time:  2:13 PM  Group Topic/Focus:  Managing Feelings:   The focus of this group is to identify what feelings patients have difficulty handling and develop a plan to handle them in a healthier way upon discharge.  Participation Level:  Active  Participation Quality:  Attentive  Affect:  Appropriate  Cognitive:  Appropriate  Insight: Appropriate  Engagement in Group:  Engaged  Modes of Intervention:  Activity and Discussion  Additional Comments:  Patient was appropriate and sharing during group.   Lyndee Hensen 09/01/2012, 2:13 PM

## 2012-09-01 NOTE — [facility] Suicide Risk Assessment (Signed)
Suicide Risk Assessment  Admission Assessment     Nursing information obtained from:  Patient Demographic factors:  Caucasian;Low socioeconomic status;Unemployed Current Mental Status:  Suicidal ideation indicated by patient;Self-harm thoughts;Self-harm behaviors Loss Factors:  Financial problems / change in socioeconomic status Historical Factors:  Family history of suicide Risk Reduction Factors:  Living with another person, especially a relative;Positive social support  CLINICAL FACTORS:   Severe Anxiety and/or Agitation Depression:   Anhedonia Comorbid alcohol abuse/dependence Hopelessness Impulsivity Insomnia Alcohol/Substance Abuse/Dependencies Previous Psychiatric Diagnoses and Treatments  COGNITIVE FEATURES THAT CONTRIBUTE TO RISK:  Closed-mindedness Polarized thinking    SUICIDE RISK:   Mild:  Suicidal ideation of limited frequency, intensity, duration, and specificity.  There are no identifiable plans, no associated intent, mild dysphoria and related symptoms, good self-control (both objective and subjective assessment), few other risk factors, and identifiable protective factors, including available and accessible social support.  PLAN OF CARE:1. Admit for crisis management and stabilization. 2. Medication management to reduce current symptoms to base line and improve the     patient's overall level of functioning 3. Treat health problems as indicated. 4. Develop treatment plan to decrease risk of relapse upon discharge and the need for     readmission. 5. Psycho-social education regarding relapse prevention and self care. 6. Health care follow up as needed for medical problems. 7. Restart home medications where appropriate.   I certify that inpatient services furnished can reasonably be expected to improve the patient's condition.  Courtney Vaughn,MD 09/01/2012, 11:44 AM

## 2012-09-02 DIAGNOSIS — F319 Bipolar disorder, unspecified: Principal | ICD-10-CM

## 2012-09-02 LAB — GLUCOSE, CAPILLARY

## 2012-09-02 MED ORDER — NICOTINE 21 MG/24HR TD PT24
21.0000 mg | MEDICATED_PATCH | Freq: Every day | TRANSDERMAL | Status: DC
  Administered 2012-09-02: 21 mg via TRANSDERMAL
  Filled 2012-09-02 (×3): qty 1

## 2012-09-02 MED ORDER — IBUPROFEN 600 MG PO TABS
600.0000 mg | ORAL_TABLET | Freq: Four times a day (QID) | ORAL | Status: DC | PRN
  Administered 2012-09-02 – 2012-09-03 (×3): 600 mg via ORAL
  Filled 2012-09-02 (×3): qty 1

## 2012-09-02 MED ORDER — CIPROFLOXACIN HCL 500 MG PO TABS
500.0000 mg | ORAL_TABLET | Freq: Two times a day (BID) | ORAL | Status: DC
  Administered 2012-09-02 – 2012-09-03 (×2): 500 mg via ORAL
  Filled 2012-09-02 (×2): qty 5
  Filled 2012-09-02 (×6): qty 1

## 2012-09-02 NOTE — Progress Notes (Signed)
[facility] MD Progress Note  09/02/2012 5:41 PM [name]  MRN:  [id number]  Subjective:  "I feel like I'm doing pretty good now that I'm able to voice my frustrations and all the things that has been distressing me over the months. I'm no longer keeping them in. That was how I got to overdose to take my life. I believe that I will be going to [facility] after discharge from here. I'm also deciding whether to go and live with my friend in [place]. She is so sober, only smokes cigarettes. My depression today is at #2, anxiety at  #4. But I feel like I'm getting there"  Diagnosis:   Axis I: Bipolar 1 disorder, Cocaine abuse Axis II: Deferred Axis III:  Past Medical History  Diagnosis Date  . Diabetes mellitus   . High cholesterol   . Depression   . Bipolar 1 disorder    Axis IV: economic problems, housing problems, occupational problems, other psychosocial or environmental problems and Substance absue issues Axis V: 41-50 serious symptoms  ADL's:  Fairly intact  Sleep: Good  Appetite:  Fair  Suicidal Ideation:  Plan:  Denies Intent:  denies Means:  Denies Homicidal Ideation:  Plan:  Denies Intent:  Denies Means:  Denies  AEB (as evidenced by): Per patient's reports  Psychiatric Specialty Exam: Review of Systems  Constitutional: Negative.   HENT: Negative.   Eyes: Negative.   Respiratory: Negative.   Cardiovascular: Negative.   Gastrointestinal: Negative.   Genitourinary: Positive for dysuria, urgency and frequency.  Musculoskeletal: Negative.   Skin: Negative.   Neurological: Negative.   Endo/Heme/Allergies: Negative.   Psychiatric/Behavioral: Positive for depression and substance abuse. Negative for suicidal ideas, hallucinations and memory loss. The patient is not nervous/anxious and does not have insomnia.     Blood pressure 132/83, pulse 89, temperature 98.5 F (36.9 C), temperature source Oral, resp. rate 18, height 5\' 5"  (1.651 m), weight 111.131 kg (245 lb),  last menstrual period 08/11/2012, SpO2 98.00%.Body mass index is 40.77 kg/(m^2).  General Appearance: Fairly Groomed and Obese  Eye Contact::  Good  Speech:  Clear and Coherent  Volume:  Normal  Mood:  "I'm improving"  Affect:  Congruent  Thought Process:  Coherent and Goal Directed  Orientation:  Full (Time, Place, and Person)  Thought Content:  Rumination  Suicidal Thoughts:  No  Homicidal Thoughts:  No  Memory:  Immediate;   Good Recent;   Good Remote;   Good  Judgement:  Fair  Insight:  Fair  Psychomotor Activity:  Normal  Concentration:  Good  Recall:  Good  Akathisia:  No  Handed:  Right  AIMS (if indicated):     Assets:  Desire for Improvement  Sleep:  Number of Hours: 6.25   Current Medications: Current Facility-Administered Medications  Medication Dose Route Frequency Provider Last Rate Last Dose  . acetaminophen (TYLENOL) tablet 650 mg  650 mg Oral Q6H PRN Kerry Hough, PA-C   650 mg at 09/02/12 0646  . alum & mag hydroxide-simeth (MAALOX/MYLANTA) 200-200-20 MG/5ML suspension 30 mL  30 mL Oral Q4H PRN Kerry Hough, PA-C      . atorvastatin (LIPITOR) tablet 20 mg  20 mg Oral Daily Kerry Hough, PA-C   20 mg at 09/02/12 0817  . chlordiazePOXIDE (LIBRIUM) capsule 25 mg  25 mg Oral QID PRN Kerry Hough, PA-C      . ciprofloxacin (CIPRO) tablet 500 mg  500 mg Oral BID Sanjuana Kava, NP      .  citalopram (CELEXA) tablet 40 mg  40 mg Oral Daily Kerry Hough, PA-C   40 mg at 09/02/12 0818  . insulin aspart (novoLOG) injection 0-15 Units  0-15 Units Subcutaneous TID WC Kerry Hough, PA-C   5 Units at 09/02/12 1704  . insulin aspart (novoLOG) injection 0-5 Units  0-5 Units Subcutaneous QHS Spencer E Simon, PA-C      . lisinopril (PRINIVIL,ZESTRIL) tablet 10 mg  10 mg Oral Daily Kerry Hough, PA-C   10 mg at 09/02/12 0818  . lithium carbonate (LITHOBID) CR tablet 300 mg  300 mg Oral Q12H Kerry Hough, PA-C   300 mg at 09/02/12 0818  . magnesium hydroxide  (MILK OF MAGNESIA) suspension 30 mL  30 mL Oral Daily PRN Kerry Hough, PA-C      . metFORMIN (GLUCOPHAGE) tablet 1,000 mg  1,000 mg Oral BID WC Kerry Hough, PA-C   1,000 mg at 09/02/12 1704  . nicotine (NICODERM CQ - dosed in mg/24 hours) patch 21 mg  21 mg Transdermal Q0600 Kerry Hough, PA-C      . ondansetron [facility]) tablet 4 mg  4 mg Oral Q6H PRN Kerry Hough, PA-C      . traZODone (DESYREL) tablet 50 mg  50 mg Oral QHS,MR X 1 Kerry Hough, PA-C   50 mg at 09/01/12 2201    Lab Results:  Results for orders placed during the hospital encounter of 08/31/12 (from the past 48 hour(s))  GLUCOSE, CAPILLARY     Status: Abnormal   Collection Time    08/31/12 10:03 PM      Result Value Range   Glucose-Capillary 154 (*) 70 - 99 mg/dL   Comment 1 Notify RN    GLUCOSE, CAPILLARY     Status: Abnormal   Collection Time    09/01/12  6:02 AM      Result Value Range   Glucose-Capillary 138 (*) 70 - 99 mg/dL  ACETAMINOPHEN LEVEL     Status: None   Collection Time    09/01/12  6:23 AM      Result Value Range   Acetaminophen (Tylenol), Serum <15.0  10 - 30 ug/mL   Comment:            THERAPEUTIC CONCENTRATIONS VARY     SIGNIFICANTLY. A RANGE OF 10-30     ug/mL MAY BE AN EFFECTIVE     CONCENTRATION FOR MANY PATIENTS.     HOWEVER, SOME ARE BEST TREATED     AT CONCENTRATIONS OUTSIDE THIS     RANGE.     ACETAMINOPHEN CONCENTRATIONS     >150 ug/mL AT 4 HOURS AFTER     INGESTION AND >50 ug/mL AT 12     HOURS AFTER INGESTION ARE     OFTEN ASSOCIATED WITH TOXIC     REACTIONS.  HEPATIC FUNCTION PANEL     Status: Abnormal   Collection Time    09/01/12  6:23 AM      Result Value Range   Total Protein 7.1  6.0 - 8.3 g/dL   Albumin 3.6  3.5 - 5.2 g/dL   AST 26  0 - 37 U/L   ALT 23  0 - 35 U/L   Alkaline Phosphatase 68  39 - 117 U/L   Total Bilirubin 0.2 (*) 0.3 - 1.2 mg/dL   Bilirubin, Direct <8.6  0.0 - 0.3 mg/dL   Indirect Bilirubin NOT CALCULATED  0.3 - 0.9 mg/dL  HEMOGLOBIN  V7Q  Status: Abnormal   Collection Time    09/01/12  6:23 AM      Result Value Range   Hemoglobin A1C 6.6 (*) <5.7 %   Comment: (NOTE)                                                                               According to the ADA Clinical Practice Recommendations for 2011, when     HbA1c is used as a screening test:      >=6.5%   Diagnostic of Diabetes Mellitus               (if abnormal result is confirmed)     5.7-6.4%   Increased risk of developing Diabetes Mellitus     References:Diagnosis and Classification of Diabetes Mellitus,Diabetes     Care,2011,34(Suppl 1):S62-S69 and Standards of Medical Care in             Diabetes - 2011,Diabetes Care,2011,34 (Suppl 1):S11-S61.   Mean Plasma Glucose 143 (*) <117 mg/dL  LITHIUM LEVEL     Status: Abnormal   Collection Time    09/01/12  6:23 AM      Result Value Range   Lithium Lvl 0.35 (*) 0.80 - 1.40 mEq/L  SALICYLATE LEVEL     Status: Abnormal   Collection Time    09/01/12  6:23 AM      Result Value Range   Salicylate Lvl <2.0 (*) 2.8 - 20.0 mg/dL  GLUCOSE, CAPILLARY     Status: None   Collection Time    09/01/12 11:57 AM      Result Value Range   Glucose-Capillary 92  70 - 99 mg/dL  GLUCOSE, CAPILLARY     Status: Abnormal   Collection Time    09/01/12  5:01 PM      Result Value Range   Glucose-Capillary 126 (*) 70 - 99 mg/dL  GLUCOSE, CAPILLARY     Status: Abnormal   Collection Time    09/01/12  9:43 PM      Result Value Range   Glucose-Capillary 139 (*) 70 - 99 mg/dL   Comment 1 Notify RN    LITHIUM LEVEL     Status: Abnormal   Collection Time    09/02/12  6:15 AM      Result Value Range   Lithium Lvl 0.39 (*) 0.80 - 1.40 mEq/L  GLUCOSE, CAPILLARY     Status: Abnormal   Collection Time    09/02/12  6:20 AM      Result Value Range   Glucose-Capillary 149 (*) 70 - 99 mg/dL  GLUCOSE, CAPILLARY     Status: Abnormal   Collection Time    09/02/12 12:12 PM      Result Value Range   Glucose-Capillary 123 (*) 70 -  99 mg/dL  GLUCOSE, CAPILLARY     Status: Abnormal   Collection Time    09/02/12  4:44 PM      Result Value Range   Glucose-Capillary 228 (*) 70 - 99 mg/dL    Physical Findings: AIMS: Facial and Oral Movements Muscles of Facial Expression: None, normal Lips and Perioral Area: None, normal Jaw: None, normal Tongue: None, normal,Extremity Movements Upper (arms, wrists,  hands, fingers): None, normal Lower (legs, knees, ankles, toes): None, normal, Trunk Movements Neck, shoulders, hips: None, normal, Overall Severity Severity of abnormal movements (highest score from questions above): None, normal Incapacitation due to abnormal movements: None, normal Patient's awareness of abnormal movements (rate only patient's report): No Awareness,    CIWA:    COWS:  COWS Total Score: 4  Treatment Plan Summary: Daily contact with patient to assess and evaluate symptoms and progress in treatment Medication management  Plan: Supportive approach/coping skills/relapse prevention. Initiated Cipro 500 mg bid for uti. Ibuprofen 600 mg Q 6 hours for pain Encouraged out of room, participation in group sessions and application of coping skills when distressed. Will continue to monitor response to/adverse effects of medications in use to assure effectiveness. Continue to monitor mood, behavior and interaction with staff and other patients. Continue current plan of care.  Medical Decision Making Problem Points:  Established problem, stable/improving (1), Review of last therapy session (1) and Review of psycho-social stressors (1) Data Points:  Review and summation of old records (2) Review of medication regiment & side effects (2)  I certify that inpatient services furnished can reasonably be expected to improve the patient's condition.   Armandina Stammer I, PMHNP-BC 09/02/2012, 5:41 PM

## 2012-09-02 NOTE — Progress Notes (Signed)
D. Pt has been up and has been active in milieu this afternoon. Pt spoke about her overdose and the stress that caused her to overdose. Pt has denied SI today but has endorsed feelings of depression and hopelessness. Pt did talk some about not being sure what she will do after discharge. Pt has spoken about her chronic pain and is mildly upset that the medications she says she is prescribed are not being provided for her here. Pt did receive medications without incident and did speak about going to [facility] for outpatient management. A. Support and encouragement provided, medication education complete. R. Pt verbalized understanding, will continue to monitor.
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
## 2012-09-02 NOTE — Progress Notes (Signed)
Recreation Therapy Notes  Date: 06.05.2014 Time: 3:00pm Location: [address] Dayroom      Group Topic/Focus: Leisure/Lifestyle Changes  Participation Level: Active  Participation Quality: Appropriate and Attentive  Affect: Euthymic  Cognitive: Appropriate   Additional Comments: Activity: Adapted Toilet Paper Game; Explanation: Patients were asked to select the amount of toilet paper they estimated they will need for the rest of the afternoon. Patients were asked to count their squares and announce the number to the group. Patients were then asked to break into groups of 2 -3 patients, using the average number of squares (11) patient groups were asked to identify that number of ways they can positively make changes to their lives. Group lists were then compared on the white board. Opening group discussion focused on the types of lifestyle changes that can be made.   Patient actively participated in group activity. Patient worked well with peer in group. Patient with peer developed a list of primarily activity changes, such as take a walk, prepare a special meal and take a beach trip. Patient was unable to identify one suggestion written on the board she could not implement into her life. Patient contributed to wrap up discussion about the importance of positive life changes to sobriety and overall wellbeing.   Marykay Lex Tyanne Derocher, LRT/CTRS  Jearl Klinefelter 09/02/2012 4:50 PM

## 2012-09-02 NOTE — [facility] Group Notes (Signed)
[facility] LCSW Group Therapy  09/02/2012  1:15 PM   Type of Therapy:  Group Therapy  Participation Level:  Active  Participation Quality:  Appropriate and Attentive  Affect:  Appropriate  Cognitive:  Alert and Appropriate  Insight:  Developing/Improving and Engaged  Engagement in Therapy:  Developing/Improving and Engaged  Modes of Intervention:  Activity, Clarification, Confrontation, Discussion, Education, Exploration, Limit-setting, Orientation, Problem-solving, Rapport Building, Dance movement psychotherapist, Socialization and Support  Summary of Progress/Problems: The topic for group was balance in life.  Pt participated in the discussion about when their life was in balance and out of balance and how this feels.  Pt discussed ways to get back in balance and short term goals they can work on to get where they want to be.  Pt shared that her life is unbalanced where there is chaos, stress and anxiety at home.  Pt states that balance for her would be a happy family with everyone getting along.  Pt was able to process past use and what this was like for her, as well as periods of time when she was able to maintain sobriety and clean time.  Pt actively participated in group discussion.    [name], LCSWA 09/02/2012 2:28 PM

## 2012-09-02 NOTE — Progress Notes (Signed)
Adult Psychoeducational Group Note  Date:  09/02/2012 Time:  3:06 PM  Group Topic/Focus:  Self Esteem Action Plan:   The focus of this group is to help patients create a plan to continue to build self-esteem after discharge.  Participation Level:  Active  Participation Quality:  Appropriate, Attentive and Sharing  Affect:  Appropriate and Depressed  Cognitive:  Appropriate  Insight: Appropriate  Engagement in Group:  Engaged  Modes of Intervention:  Activity and Discussion  Additional Comments:  Patient was attentive and engaged. Patient became teary-eye when discussing the challenges of her marriage.   Lyndee Hensen 09/02/2012, 3:06 PM

## 2012-09-02 NOTE — [facility] Group Notes (Signed)
[facility] LCSW Aftercare Discharge Planning Group Note   09/02/2012 8:45 AM  Participation Quality:  Alert and Appropriate   Mood/Affect:  Appropriate  Depression Rating:  2-3  Anxiety Rating:  2-3  Thoughts of Suicide:  Pt denies SI/HI  Will you contract for safety?   Yes  Current AVH:  No  Plan for Discharge/Comments:  Pt attended discharge planning group and actively participated in group.  CSW provided pt with today's workbook.  Pt states that since being here she has had time to look at her life and realizes people she thought would be there for her aren't.  Pt states that she will return home in [place], for now at least.  Pt has follow up scheduled at [facility]: [facility] for medication management and therapy.  No further needs voiced by pt at this time.    Transportation Means: Pt states that she doesn't have transportation but friends or family will pick her up upon d/c  Supports: Pt states that she has limited support due to no one visiting her while here.    Reyes Ivan, LCSWA 09/02/2012 9:57 AM

## 2012-09-03 LAB — GLUCOSE, CAPILLARY: Glucose-Capillary: 132 mg/dL — ABNORMAL HIGH (ref 70–99)

## 2012-09-03 MED ORDER — CITALOPRAM HYDROBROMIDE 40 MG PO TABS: 40.0000 mg | ORAL_TABLET | Freq: Every day | ORAL | Status: DC

## 2012-09-03 MED ORDER — CIPROFLOXACIN HCL 500 MG PO TABS: 500.0000 mg | ORAL_TABLET | Freq: Two times a day (BID) | ORAL | Status: DC

## 2012-09-03 MED ORDER — TRAZODONE HCL 50 MG PO TABS: 50.0000 mg | ORAL_TABLET | Freq: Every evening | ORAL | Status: DC | PRN

## 2012-09-03 MED ORDER — LITHIUM CARBONATE ER 300 MG PO TBCR: 300.0000 mg | EXTENDED_RELEASE_TABLET | Freq: Two times a day (BID) | ORAL | Status: DC

## 2012-09-03 MED ORDER — LISINOPRIL 10 MG PO TABS: 10.0000 mg | ORAL_TABLET | Freq: Every day | ORAL | Status: DC

## 2012-09-03 MED ORDER — ATORVASTATIN CALCIUM 20 MG PO TABS: 20.0000 mg | ORAL_TABLET | Freq: Every day | ORAL | Status: DC

## 2012-09-03 MED ORDER — METFORMIN HCL 1000 MG PO TABS: 1000.0000 mg | ORAL_TABLET | Freq: Two times a day (BID) | ORAL | Status: DC

## 2012-09-03 MED ORDER — SENNA 8.6 MG PO TABS: 1.0000 | ORAL_TABLET | Freq: Two times a day (BID) | ORAL | Status: DC

## 2012-09-03 NOTE — Progress Notes (Signed)
[facility] Adult Case Management Discharge Plan :  Will you be returning to the same living situation after discharge: Yes,  returning home At discharge, do you have transportation home?:Yes,  access to transportation Do you have the ability to pay for your medications:Yes,  access to meds  Release of information consent forms completed and in the chart;  Patient's signature needed at discharge.  Patient to Follow up at: Follow-up Information   Follow up with [facility] On 09/07/2012. (Walk in clinic Monday - Friday 8 am - 3 pm for hospital discharge appointment)    Contact information:   [address] [place], [place] [address] Phone: [phone] Fax: [phone]      Patient denies SI/HI:   Yes,  denies SI/HI    Safety Planning and Suicide Prevention discussed:  Yes,  discussed with pt.  Attempts made to contact husband.  See suicide prevention note.  [name] 09/03/2012, 10:52 AM

## 2012-09-03 NOTE — Progress Notes (Signed)
Discharge Note: Discharge instructions/prescriptions/medication samples given to patient. Patient verbalized understanding of discharge instructions and prescriptions. Returned belongings to patient. Denies SI/HI/AVH. Patient d/c without incident.
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
## 2012-09-03 NOTE — [facility] Group Notes (Signed)
[facility] LCSW Group Therapy  09/03/2012  1:15 PM   Type of Therapy:  Group Therapy  Participation Level:  Active  Participation Quality:  Appropriate and Attentive  Affect:  Appropriate and Tearful  Cognitive:  Alert and Appropriate  Insight:  Developing/Improving and Engaged  Engagement in Therapy:  Developing/Improving and Engaged  Modes of Intervention:  Activity, Clarification, Confrontation, Discussion, Education, Exploration, Limit-setting, Orientation, Problem-solving, Rapport Building, Dance movement psychotherapist, Socialization and Support  Summary of Progress/Problems: The topic for today was feelings about relapse.  Pt discussed what relapse prevention is to them and identified triggers that they are on the path to relapse.  Pt processed their feeling towards relapse and was able to relate to peers.  Pt discussed coping skills that can be used for relapse prevention.   Pt shared that she is afraid of going back to the same state of mind.  Pt states that she is still considering leaving her husband and going to [place] to be with her friend but struggles with this decision.  Pt was able to process the conversation she had with her husband today and the decision she is faced with.  Pt was tearful when discussing this but thankful for peers feedback and helping her while here.  Pt was able to realize that her marriage is unhealthy and needs to work on herself.  Pt actively participated in group discussion.    Reyes Ivan, LCSWA 09/03/2012 2:19 PM

## 2012-09-03 NOTE — Tx Team (Signed)
Interdisciplinary Treatment Plan Update (Adult)  Date: 09/03/2012  Time Reviewed: 9:45 AM   Progress in Treatment: Attending groups: Yes Participating in groups: Yes Taking medication as prescribed:  Yes Tolerating medication:  Yes Family/Significant othe contact made: No, attempts made Patient understands diagnosis: Yes Discussing patient identified problems/goals with staff: Yes Medical problems stabilized or resolved:  Yes Denies suicidal/homicidal ideation: Yes Patient has not harmed self or Others: Yes  New problem(s) identified: None Identified  Discharge Plan or Barriers:  Pt will follow up with [facility] for medication management and therapy.    Additional comments: N/A  Reason for Continuation of Hospitalization: Stable to d/c  Estimated length of stay: D/C today  For review of initial/current patient goals, please see plan of care.  Attendees: Patient:     Family:     Physician:  Dr. Geoffery Lyons 09/03/2012 9:48 AM   Nursing:   Alease Frame RN 09/03/2012 9:48 AM   Clinical Social Worker: Reyes Ivan, LCSWA 09/03/2012 9:48 AM   Other:  Quintella Reichert, RN 09/03/2012 9:48 AM   Other:  Trula Slade, LCSWA 09/03/2012 9:48 AM   Other:  Massie Kluver, Community Care Coordinator 09/03/2012 9:48 AM   Other:  Ronda Fairly, LCSWA 09/03/2012 9:48 AM    Scribe for Treatment Team:   Reyes Ivan, LCSWA  09/03/2012 9:48 AM

## 2012-09-03 NOTE — [facility] Group Notes (Signed)
[facility] LCSW Aftercare Discharge Planning Group Note   09/03/2012 8:45 AM  Participation Quality:  Alert and Appropriate   Mood/Affect:  Appropriate  Depression Rating:  2  Anxiety Rating:  5  Thoughts of Suicide:  Pt denies SI/HI  Will you contract for safety?   Yes  Current AVH:  No  Plan for Discharge/Comments:  Pt attended discharge planning group and actively participated in group.  CSW provided pt with today's workbook.  Pt states that she will return home in [place], for now at least.  Pt has follow up scheduled at [facility] for medication management and therapy.  No further needs voiced by pt at this time.    Transportation Means: Pt states that she doesn't have transportation but friends or family will pick her up upon d/c  Supports: Pt states that she has limited support due to no one visiting her while here.    Reyes Ivan, LCSWA 09/03/2012 9:40 AM

## 2012-09-03 NOTE — Progress Notes (Signed)
Adult Psychoeducational Group Note  Date:  09/03/2012 Time:  1:44 PM  Group Topic/Focus:  Relapse Prevention Planning:   The focus of this group is to define relapse and discuss the need for planning to combat relapse.  Participation Level:  Active  Participation Quality:  Appropriate, Redirectable and Sharing  Affect:  Anxious, Appropriate and Tearful  Cognitive:  Appropriate  Insight: Appropriate  Engagement in Group:  Engaged  Modes of Intervention:  Activity, Discussion and Education  Additional Comments:  Patient was upset in the beginning of group because phone time ended and she wanted to continue a conversation with a loved one. Patient was informed that the role of the tech is to care for needs of all patients and to follow the rules and guidelines of on the unit. Patient became teary-eyed and was escorted to consult with her nurse. Patient returned to group and participated and contributed to an activity involving the creation of a relapse prevention plan. Patient shared experiences and remained appropriate throughout the rest of the group.   Lyndee Hensen 09/03/2012, 1:44 PM

## 2012-09-03 NOTE — Discharge Summary (Signed)
Physician Discharge Summary Note  Patient:  [name] is an 47 y.o., female MRN:  [id number] DOB:  Sep 26, 1965 Patient phone:  [phone] (home)  Patient address:   [address]  [address] [place] [address],   Date of Admission:  08/31/2012 Date of Discharge: 09/03/12  Reason for Admission:  Drug abuse  Discharge Diagnoses: Principal Problem:   Bipolar 1 disorder Active Problems:   Cocaine abuse  Review of Systems  Constitutional: Negative.        Obese  HENT: Negative.   Eyes: Negative.   Respiratory: Negative.   Cardiovascular: Negative.   Gastrointestinal: Negative.   Genitourinary: Negative.   Musculoskeletal: Negative.   Skin: Negative.   Neurological: Negative.   Endo/Heme/Allergies: Negative.   Psychiatric/Behavioral: Positive for depression (Stabilized with medication prior to discharge) and substance abuse (Cocaine abuse). Negative for suicidal ideas, hallucinations and memory loss. The patient is nervous/anxious (Stabilized with medication prior to discharge) and has insomnia (Stabilized with medication prior to discharge).    Axis Diagnosis:   AXIS [name]:  Bipolar 1 disorder, Cocaine abuse AXIS II:  Deferred AXIS III:   Past Medical History  Diagnosis Date  . Diabetes mellitus   . High cholesterol   . Depression   . Bipolar 1 disorder    AXIS IV:  economic problems, housing problems, occupational problems, other psychosocial or environmental problems and Substance absue issues AXIS V:  64  Level of Care:  OP  Hospital Course: This is a 47 year old Caucasian female. Admitted to [facility] from the [facility] ED with complaints of increased depression, suicide attempt by overdose and cocaine abuse. Patient reports, "[name] was taken to the hospital last Friday by the EMS and the [employer]. [name] intentionally overdosed on that Friday afternoon. [name] was sick and tired of feeling sick and tired. [name] was recently kicked out of Courtney parents house. It has been 2 weeks.  [name] went to stay with Courtney mother in law, but it was not so feasible for me to live there. [name] have no income or a job. [name] have been trying to get Courtney disability. [name] went to Prison last year for larceny. [name] am using cocaine just to self medicate for Courtney bipolar disorder. [name] was diagnosed a long time ago. [name] have bad mood swings and racing thoughts. [name] got angry quick and [name] have problems controlling it sometimes. [name] started Lithium Carbonate 3 days ago. It was started by Dr. Stevphen [name]. He treated Courtney sister as well. [name] don't believe that [name] have substance abuse problems. [name] have been using it daily for the past 2 weeks. [name] have gone for about 18 months without touching drugs. [name] don't need drug treatment. [name] am no longer suicidal."   While a patient in this hospital and after admission assessment/evaluation, it was determined based on patient's symptoms that she will need medication management to stabilize her current depressive mood. Although report indicated history of substance abuse including alcohol, her UDS report indicated (+) benzodiazepine and cocaine, [name] was not presenting any withdrawal symptoms on arrival as well. As a result, she was not started on any detoxification treatment protocol. However, she was ordered and received Trazodone 50 mg Q bedtime for sleep, Citalopram 500 mg for depression and Lithium Carbonate 300 mg for mood stabilization. She also was enrolled in group counseling sessions and activities where she was counseled and learned coping skills that should help her cope better and manage her symptoms effectively  after discharge. She also received medication management and monitoring for her other previously existing medical issues and concerns that she presented. She tolerated her treatment regimen without any significant adverse effects and or reactions presented.   Patient did respond positively to her treatment regimen. This is evidenced by her daily reports of improved mood, reduction of  symptoms and presentation of good affect/eye contact. She attended treatment team meeting this am and met with her treatment team members. Her reason for admission, present symptoms, response to treatment and discharge plans discussed with patient. [name] endorsed that her symptoms has stabilized and that she is ready for discharge to pursue psychiatric care on outpatient basis. It was then agreed upon that patient will follow-up care at [facility] here in [place], [place] on 09/07/12 [name] between the hours of 08:00 am and 03:00 PM. Patient is also informed that this is a walk-in appointment as well. The address, date, time and contact information for this clinic provided for patient in writing.   Upon discharge, [name] adamantly denies any suicidal, homicidal ideations, auditory, visual hallucinations, paranoia and or delusional thoughts. She was provided with 14 days worth supply samples of her [facility] discharge medications. She left [facility] with all personal belongings via personal arranged transport in no apparent distress.   Consults:  psychiatry  Significant Diagnostic Studies:  labs: CBC with diff, CMP, UDS, Toxicology tests, U/A, Lithium levels  Discharge Vitals:   Blood pressure 132/83, pulse 89, temperature 98.5 F (36.9 C), temperature source Oral, resp. rate 18, height 5\' 5"  (1.651 m), weight 111.131 kg (245 lb), last menstrual period 08/11/2012, SpO2 98.00%. Body mass index is 40.77 kg/(m^2). Lab Results:   Results for orders placed during the hospital encounter of 08/31/12 (from the past 72 hour(s))  GLUCOSE, CAPILLARY     Status: Abnormal   Collection Time    08/31/12  5:20 PM      Result Value Range   Glucose-Capillary 112 (*) 70 - 99 mg/dL  GLUCOSE, CAPILLARY     Status: Abnormal   Collection Time    08/31/12 10:03 PM      Result Value Range   Glucose-Capillary 154 (*) 70 - 99 mg/dL   Comment 1 Notify RN    GLUCOSE, CAPILLARY     Status: Abnormal   Collection Time    09/01/12  6:02  AM      Result Value Range   Glucose-Capillary 138 (*) 70 - 99 mg/dL  ACETAMINOPHEN LEVEL     Status: None   Collection Time    09/01/12  6:23 AM      Result Value Range   Acetaminophen (Tylenol), Serum <15.0  10 - 30 ug/mL   Comment:            THERAPEUTIC CONCENTRATIONS VARY     SIGNIFICANTLY. A RANGE OF 10-30     ug/mL MAY BE AN EFFECTIVE     CONCENTRATION FOR MANY PATIENTS.     HOWEVER, SOME ARE BEST TREATED     AT CONCENTRATIONS OUTSIDE THIS     RANGE.     ACETAMINOPHEN CONCENTRATIONS     >150 ug/mL AT 4 HOURS AFTER     INGESTION AND >50 ug/mL AT 12     HOURS AFTER INGESTION ARE     OFTEN ASSOCIATED WITH TOXIC     REACTIONS.  HEPATIC FUNCTION PANEL     Status: Abnormal   Collection Time    09/01/12  6:23 AM      Result  Value Range   Total Protein 7.1  6.0 - 8.3 g/dL   Albumin 3.6  3.5 - 5.2 g/dL   AST 26  0 - 37 U/L   ALT 23  0 - 35 U/L   Alkaline Phosphatase 68  39 - 117 U/L   Total Bilirubin 0.2 (*) 0.3 - 1.2 mg/dL   Bilirubin, Direct <1.6  0.0 - 0.3 mg/dL   Indirect Bilirubin NOT CALCULATED  0.3 - 0.9 mg/dL  HEMOGLOBIN X0R     Status: Abnormal   Collection Time    09/01/12  6:23 AM      Result Value Range   Hemoglobin A1C 6.6 (*) <5.7 %   Comment: (NOTE)                                                                               According to the ADA Clinical Practice Recommendations for 2011, when     HbA1c is used as a screening test:      >=6.5%   Diagnostic of Diabetes Mellitus               (if abnormal result is confirmed)     5.7-6.4%   Increased risk of developing Diabetes Mellitus     References:Diagnosis and Classification of Diabetes Mellitus,Diabetes     Care,2011,34(Suppl 1):S62-S69 and Standards of Medical Care in             Diabetes - 2011,Diabetes Care,2011,34 (Suppl 1):S11-S61.   Mean Plasma Glucose 143 (*) <117 mg/dL  LITHIUM LEVEL     Status: Abnormal   Collection Time    09/01/12  6:23 AM      Result Value Range   Lithium Lvl 0.35  (*) 0.80 - 1.40 mEq/L  SALICYLATE LEVEL     Status: Abnormal   Collection Time    09/01/12  6:23 AM      Result Value Range   Salicylate Lvl <2.0 (*) 2.8 - 20.0 mg/dL  GLUCOSE, CAPILLARY     Status: None   Collection Time    09/01/12 11:57 AM      Result Value Range   Glucose-Capillary 92  70 - 99 mg/dL  GLUCOSE, CAPILLARY     Status: Abnormal   Collection Time    09/01/12  5:01 PM      Result Value Range   Glucose-Capillary 126 (*) 70 - 99 mg/dL  GLUCOSE, CAPILLARY     Status: Abnormal   Collection Time    09/01/12  9:43 PM      Result Value Range   Glucose-Capillary 139 (*) 70 - 99 mg/dL   Comment 1 Notify RN    LITHIUM LEVEL     Status: Abnormal   Collection Time    09/02/12  6:15 AM      Result Value Range   Lithium Lvl 0.39 (*) 0.80 - 1.40 mEq/L  GLUCOSE, CAPILLARY     Status: Abnormal   Collection Time    09/02/12  6:20 AM      Result Value Range   Glucose-Capillary 149 (*) 70 - 99 mg/dL  GLUCOSE, CAPILLARY     Status: Abnormal   Collection Time    09/02/12 12:12  PM      Result Value Range   Glucose-Capillary 123 (*) 70 - 99 mg/dL  GLUCOSE, CAPILLARY     Status: Abnormal   Collection Time    09/02/12  4:44 PM      Result Value Range   Glucose-Capillary 228 (*) 70 - 99 mg/dL  GLUCOSE, CAPILLARY     Status: Abnormal   Collection Time    09/02/12  9:09 PM      Result Value Range   Glucose-Capillary 133 (*) 70 - 99 mg/dL   Comment 1 Notify RN     Comment 2 Documented in Chart      Physical Findings: AIMS: Facial and Oral Movements Muscles of Facial Expression: None, normal Lips and Perioral Area: None, normal Jaw: None, normal Tongue: None, normal,Extremity Movements Upper (arms, wrists, hands, fingers): None, normal Lower (legs, knees, ankles, toes): None, normal, Trunk Movements Neck, shoulders, hips: None, normal, Overall Severity Severity of abnormal movements (highest score from questions above): None, normal Incapacitation due to abnormal  movements: None, normal Patient's awareness of abnormal movements (rate only patient's report): No Awareness,    CIWA:    COWS:  COWS Total Score: 4  Psychiatric Specialty Exam: See Psychiatric Specialty Exam and Suicide Risk Assessment completed by Attending Physician prior to discharge.  Discharge destination:  Home  Is patient on multiple antipsychotic therapies at discharge:  No   Has Patient had three or more failed trials of antipsychotic monotherapy by history:  No  Recommended Plan for Multiple Antipsychotic Therapies: NA     Medication List    STOP taking these medications       acetaminophen 325 MG tablet  Commonly known as:  TYLENOL     acetaminophen 650 MG suppository  Commonly known as:  TYLENOL     diazepam 2 MG tablet  Commonly known as:  VALIUM     heparin 5000 UNIT/ML injection     ketorolac 30 MG/ML injection  Commonly known as:  TORADOL     multivitamin with minerals Tabs     nicotine 7 mg/24hr patch  Commonly known as:  NICODERM CQ - dosed in mg/24 hr     ondansetron 4 MG tablet  Commonly known as:  ZOFRAN     ondansetron 4 MG/2ML Soln injection  Commonly known as:  ZOFRAN     sodium chloride 0.9 % injection      TAKE these medications     Indication   atorvastatin 20 MG tablet  Commonly known as:  LIPITOR  Take 1 tablet (20 mg total) by mouth daily. For cholesterol control   Indication:  Inherited Homozygous Hypercholesterolemia     ciprofloxacin 500 MG tablet  Commonly known as:  CIPRO  Take 1 tablet (500 mg total) by mouth 2 (two) times daily. For Urinary tract infections   Indication:  Urinary tract infections     citalopram 40 MG tablet  Commonly known as:  CELEXA  Take 1 tablet (40 mg total) by mouth daily. For depression   Indication:  Depression     lisinopril 10 MG tablet  Commonly known as:  PRINIVIL,ZESTRIL  Take 1 tablet (10 mg total) by mouth daily. For high blood pressure control   Indication:  High Blood Pressure      lithium carbonate 300 MG CR tablet  Commonly known as:  LITHOBID  Take 1 tablet (300 mg total) by mouth every 12 (twelve) hours. For mood stabilization   Indication:  Manic Phase of Manic-Depression,  Mood stabilization     metFORMIN 1000 MG tablet  Commonly known as:  GLUCOPHAGE  Take 1 tablet (1,000 mg total) by mouth 2 (two) times daily with a meal. For diabetes control   Indication:  Type 2 Diabetes     senna 8.6 MG Tabs  Commonly known as:  SENOKOT  Take 1 tablet (8.6 mg total) by mouth 2 (two) times daily. For constipation   Indication:  Constipation     traZODone 50 MG tablet  Commonly known as:  DESYREL  Take 1 tablet (50 mg total) by mouth at bedtime and may repeat dose one time if needed. For sleep   Indication:  Trouble Sleeping       Follow-up Information   Follow up with [facility] On 09/07/2012. (Walk in clinic Monday - Friday 8 am - 3 pm for hospital discharge appointment)    Contact information:   [address] [place] [address] Phone: [phone] Fax: [phone]     Follow-up recommendations: Activity:  As tolerated Diet: As recommended by your primary care doctor. Keep all scheduled follow-up appointments as recommended.   Continue to work the relapse prevention plan Comments:  Take all your medications as prescribed by your mental healthcare provider. Report any adverse effects and or reactions from your medicines to your outpatient provider promptly. Patient is instructed and cautioned to not engage in alcohol and or illegal drug use while on prescription medicines. In the event of worsening symptoms, patient is instructed to call the crisis hotline, 911 and or go to the nearest ED for appropriate evaluation and treatment of symptoms. Follow-up with your primary care provider for your other medical issues, concerns and or health care needs.   Total Discharge Time:  Greater than 30 minutes.  Signed: Sanjuana Kava, PMHNP 09/03/2012, 10:51  AM Agree with assessment and plan Reymundo Poll. Dub Mikes, M.D.

## 2012-09-03 NOTE — [facility] Suicide Risk Assessment (Signed)
Suicide Risk Assessment  Discharge Assessment     Demographic Factors:  Caucasian  Mental Status Per Nursing Assessment::   On Admission:  Suicidal ideation indicated by patient;Self-harm thoughts;Self-harm behaviors  Current Mental Status by Physician: In full contact with reality. There are no suicidal ideas, plans or intent. Her mood is "better" her affect is appropriate. She is willing and motivated to pursue further outpatient treatment. She is going to call her husband and depending on his "attitude," she will go back with him, or if she does not like his attitude, will ask a good friend from [place] to allow her to stay there. In eihter case there will not be any drugs or alcohol available. She states that she has learned a lot out of this experience. She is more mindful of how her mood changes. She states that she can today recognize that she is getting upset and chose to have healthier coping skills and not repeat the behavior that go there here   Loss Factors: Decline in physical health  Historical Factors: NA  Risk Reduction Factors:   Sense of responsibility to family and Positive social support  Continued Clinical Symptoms:  Bipolar Disorder:   Depressive phase Alcohol/Substance Abuse/Dependencies  Cognitive Features That Contribute To Risk: None identified   Suicide Risk:  Minimal: No identifiable suicidal ideation.  Patients presenting with no risk factors but with morbid ruminations; may be classified as minimal risk based on the severity of the depressive symptoms  Discharge Diagnoses:   AXIS I:  Bipolar Disorder Unspecified, Cocaine Abuse AXIS II:  Deferred AXIS III:   Past Medical History  Diagnosis Date  . Diabetes mellitus   . High cholesterol   . Depression   . Bipolar 1 disorder    AXIS IV:  problems with primary support group AXIS V:  61-70 mild symptoms  Plan Of Care/Follow-up recommendations:  Activity:  as tolerated Diet:  regular Follow  up outpatient basis Is patient on multiple antipsychotic therapies at discharge:  No   Has Patient had three or more failed trials of antipsychotic monotherapy by history:  No  Recommended Plan for Multiple Antipsychotic Therapies: N/A   [name] A 09/03/2012, 3:29 PM

## 2012-09-03 NOTE — [facility] Suicide Risk Assessment (Signed)
[facility] INPATIENT:  Family/Significant Other Suicide Prevention Education  Suicide Prevention Education:  Contact Attempts: [name] - husband [phone]), (name of family member/significant other) has been identified by the patient as the family member/significant other with whom the patient will be residing, and identified as the person(s) who will aid the patient in the event of a mental health crisis.  With written consent from the patient, two attempts were made to provide suicide prevention education, prior to and/or following the patient's discharge.  We were unsuccessful in providing suicide prevention education.  A suicide education pamphlet was given to the patient to share with family/significant other.  Date and time of first attempt: 09/02/12 @ 3:00 pm Date and time of second attempt: 09/02/12 @ 3:15 pm  CSW attempted to reach pt's husband at the number listed above as well as numbers listed in the chart with no luck.  Pt states that she hasn't heard from husband since being in the hospital and that he is no longer being supportive and is considering leaving him.    [name] 09/03/2012, 9:35 AM

## 2012-09-07 NOTE — Progress Notes (Signed)
Patient Discharge Instructions:  After Visit Summary (AVS):   Faxed to:  09/07/12 Discharge Summary Note:   Faxed to:  09/07/12 Psychiatric Admission Assessment Note:   Faxed to:  09/07/12 Suicide Risk Assessment - Discharge Assessment:   Faxed to:  09/07/12 Faxed/Sent to the Next Level Care provider:  09/07/12 Faxed to [facility] @ [phone]  Jerelene Redden, 09/07/2012, 4:08 PM

## 2012-09-17 ENCOUNTER — Telehealth: Payer: Self-pay | Admitting: Family Medicine

## 2012-09-17 DIAGNOSIS — E669 Obesity, unspecified: Secondary | ICD-10-CM

## 2012-09-17 NOTE — Telephone Encounter (Signed)
[place]. She needs a prescription for a meter and test strips. Pharmacy [facility]  [phone] Please advise

## 2012-09-21 MED ORDER — METFORMIN HCL 1000 MG PO TABS: 1000.0000 mg | ORAL_TABLET | Freq: Two times a day (BID) | ORAL | Status: DC

## 2012-09-21 NOTE — Telephone Encounter (Signed)
Called and ordered metformin, DM meter, test strips and lancets for once daily testing.  Informed patient she should make an appointment to see me in the next month.  Staying in [place], away from family to get out of her stressful situation.

## 2012-09-23 ENCOUNTER — Telehealth: Payer: Self-pay | Admitting: Family Medicine

## 2012-09-23 NOTE — Telephone Encounter (Signed)
Patient is calling to schedule an appt with Dr. Leveda Anna.  She says that she will be in town on 7/15th at 2:00, but Dr. Leveda Anna doesn't have an opening.  But, she also said that he said he will make time to see her, she just needs to let him know when she will be coming so that we can block a time on his schedule.

## 2012-09-23 NOTE — Telephone Encounter (Signed)
Will fwd to MD.  Osman Calzadilla L, CMA  

## 2012-09-24 NOTE — Telephone Encounter (Signed)
OK to schedule for me on 7/15 at 2:00 PM

## 2012-09-27 ENCOUNTER — Telehealth: Payer: Self-pay | Admitting: Family Medicine

## 2012-09-27 DIAGNOSIS — F1410 Cocaine abuse, uncomplicated: Secondary | ICD-10-CM

## 2012-09-27 DIAGNOSIS — T6592XD Toxic effect of unspecified substance, intentional self-harm, subsequent encounter: Secondary | ICD-10-CM

## 2012-09-27 NOTE — Telephone Encounter (Signed)
Notes faxed to Dr. Lorelle Gibbs.  Adrijana Haros, Darlyne Russian, CMA

## 2012-09-27 NOTE — Telephone Encounter (Signed)
Patient is calling for advice on her bladder issues. [name]

## 2012-09-27 NOTE — Telephone Encounter (Signed)
[name] with Dr. Lorelle Gibbs (patient's pain management MD) called.  MD requesting office visit notes and hospital stay consult.  Pt has appt next week and Dr. Lorelle Gibbs is evaluating whether to continue to prescribe meds to patient..  Will fwd to MD for advice and what notes or letter to fax.  Fax # is [phone].  Lania Zawistowski, Darlyne Russian, CMA

## 2012-09-27 NOTE — Telephone Encounter (Signed)
Please advise. [name] S  

## 2012-09-27 NOTE — Telephone Encounter (Signed)
Please send copies of this note and copies of the notes from recent behavioral med and [facility] admission. Patient has had polysubstance abuse.  She has been misusing her prescription medicine, even before her recent hospitalization for a suicide attempt.  I have confirmation of this misuse from other parties.  I also know that she comes from a family in which prescription and non prescription drug abuse is rampant.    I have made the decision that she will receive no further controled substances from my office.  I like Hind and intend to continue treating her.  I will not add to her problems by giving her medications that tempt her to misuse.

## 2012-09-29 ENCOUNTER — Telehealth: Payer: Self-pay | Admitting: Family Medicine

## 2012-09-29 NOTE — Telephone Encounter (Signed)
Patient is calling because no one returned her phone call from earlier this week. Patient states that she is having to change clothes about 3-4 times a day, because of her bladder issues. She is using bladder control pads with no help.

## 2012-09-29 NOTE — Telephone Encounter (Signed)
Pt reports bladder control issues, especially when coughing or sneezing but now is having to get up 5-7 times a night with urinary leakage. Pt would like for meds to be called - explained that this is not possible because we need to determine the cause of bladder issues. Recommended [place] exrcises ( pt verbalized understanding of these), nothing to drink after 7 pm - maybe a few ice chips to wet her mouth and to go to the bathroom every hour while awake to train bladder. States that she still is having strange sensations when urinating and incomplete bladder emptying. Again , reminded pt of suggestions and is welcome to make early appiontment, states she cannot come earlier due to financial constraints. Any further problems to please call office. Wyatt Haste, RN-BSN

## 2012-10-04 ENCOUNTER — Other Ambulatory Visit: Payer: Self-pay | Admitting: Family Medicine

## 2012-10-07 ENCOUNTER — Other Ambulatory Visit: Payer: Self-pay

## 2012-10-12 ENCOUNTER — Encounter: Payer: Self-pay | Admitting: Family Medicine

## 2012-10-12 ENCOUNTER — Ambulatory Visit (INDEPENDENT_AMBULATORY_CARE_PROVIDER_SITE_OTHER): Payer: Medicaid Other | Admitting: Family Medicine

## 2012-10-12 VITALS — BP 126/63 | HR 92 | Temp 99.1°F | Ht 65.0 in | Wt 246.6 lb

## 2012-10-12 DIAGNOSIS — F419 Anxiety disorder, unspecified: Secondary | ICD-10-CM

## 2012-10-12 DIAGNOSIS — E119 Type 2 diabetes mellitus without complications: Secondary | ICD-10-CM

## 2012-10-12 DIAGNOSIS — E669 Obesity, unspecified: Secondary | ICD-10-CM

## 2012-10-12 DIAGNOSIS — I10 Essential (primary) hypertension: Secondary | ICD-10-CM

## 2012-10-12 DIAGNOSIS — F411 Generalized anxiety disorder: Secondary | ICD-10-CM

## 2012-10-12 DIAGNOSIS — M545 Low back pain: Secondary | ICD-10-CM

## 2012-10-12 DIAGNOSIS — F319 Bipolar disorder, unspecified: Secondary | ICD-10-CM

## 2012-10-12 NOTE — Patient Instructions (Signed)
I will call with lab results. Please make plans to take back control over your own life. See me in one month.  Sooner if you get desperate.

## 2012-10-12 NOTE — Assessment & Plan Note (Signed)
Well controled. 

## 2012-10-12 NOTE — Progress Notes (Signed)
  Subjective:    Patient ID: [name], female    DOB: Apr 29, 1965, 47 y.o.   MRN: [id number]  HPI   FU hospitalization for an intentional OD.  Medically stabilized then moved to [facility] for inpatient treatment.  Now out and life is a mess.  Very depressed, not suicidal or homicidal.  Staying with a friend in [place] (I have correct contact number listed.)  Problems abound. Depressed with marked mood swings.  Feels like she is on a roller coaster.  Lithium has not helped much yet. Chronic pain - back and legs: angry at me for not filling controled substances.  On gabapentin but complains bitterly. Relationship: husband not supporting emotionally or financially.  I am not sure if they will remain married or not.  She is quite bitter about his lack of support. Financial.  No income (not on disability), does not have a stable, long term plan for where she will live.  States she cannot work due to back pain.      Review of Systems     Objective:   Physical Exam Very tearful.  Wt and BP OK. Oriented and grounded throughout the discussion.       Assessment & Plan:

## 2012-10-12 NOTE — Assessment & Plan Note (Signed)
Very difficult situation - she has dug herself a very deep hole and currently sees no way out.  She keeps looking for others to rescue her (me with narcotics or other drugs, her husband to financially support her, the system to recognize that she is disabled).  Very much an external locus of control.  I offered to do what I can and stated the obvious - most of her long term health and happiness is in her hands.  She needs less dependancy and more control over her own life.    Check lithium level and adjust dose as indicated.

## 2012-10-12 NOTE — Assessment & Plan Note (Signed)
Complains of ongoing panic attacks despite citalopram.  No change in meds.

## 2012-10-12 NOTE — Assessment & Plan Note (Signed)
Control at goal per recent A1C.  States taking meds.

## 2012-10-13 LAB — BASIC METABOLIC PANEL
Calcium: 9.3 mg/dL (ref 8.4–10.5)
Creat: 0.85 mg/dL (ref 0.50–1.10)

## 2012-10-13 LAB — TSH: TSH: 3.89 u[IU]/mL (ref 0.350–4.500)

## 2012-10-14 ENCOUNTER — Encounter: Payer: Self-pay | Admitting: Family Medicine

## 2012-10-21 ENCOUNTER — Telehealth: Payer: Self-pay | Admitting: Family Medicine

## 2012-10-21 MED ORDER — LITHIUM CARBONATE ER 300 MG PO TBCR: 300.0000 mg | EXTENDED_RELEASE_TABLET | Freq: Three times a day (TID) | ORAL | Status: DC

## 2012-10-21 MED ORDER — GABAPENTIN 600 MG PO TABS: 600.0000 mg | ORAL_TABLET | Freq: Three times a day (TID) | ORAL | Status: DC

## 2012-10-21 NOTE — Telephone Encounter (Signed)
Please advise and I will be happy to call pt and give instructions. Wyatt Haste, RN-BSN

## 2012-10-21 NOTE — Telephone Encounter (Signed)
Pt was calling about her lab results and if she needs to do anything differently . JW

## 2012-10-21 NOTE — Telephone Encounter (Signed)
Told to increase lithium.  Wants to increase gabapentin.  OK.  Also want referral to counselor her in [place] for individual and marriage counseling.

## 2012-12-01 ENCOUNTER — Ambulatory Visit: Payer: Self-pay | Admitting: Family Medicine

## 2012-12-03 ENCOUNTER — Other Ambulatory Visit: Payer: Self-pay | Admitting: Family Medicine

## 2012-12-03 MED ORDER — CYCLOBENZAPRINE HCL 10 MG PO TABS: 10.0000 mg | ORAL_TABLET | Freq: Three times a day (TID) | ORAL | Status: DC

## 2012-12-17 ENCOUNTER — Ambulatory Visit: Payer: Self-pay | Admitting: Family Medicine

## 2012-12-30 ENCOUNTER — Encounter: Payer: Self-pay | Admitting: Family Medicine

## 2012-12-31 ENCOUNTER — Encounter: Payer: Self-pay | Admitting: Family Medicine

## 2012-12-31 ENCOUNTER — Ambulatory Visit (INDEPENDENT_AMBULATORY_CARE_PROVIDER_SITE_OTHER): Payer: Medicaid Other | Admitting: Family Medicine

## 2012-12-31 VITALS — BP 140/73 | HR 108 | Temp 99.1°F | Wt 246.3 lb

## 2012-12-31 DIAGNOSIS — K589 Irritable bowel syndrome without diarrhea: Secondary | ICD-10-CM

## 2012-12-31 DIAGNOSIS — E1169 Type 2 diabetes mellitus with other specified complication: Secondary | ICD-10-CM

## 2012-12-31 DIAGNOSIS — F319 Bipolar disorder, unspecified: Secondary | ICD-10-CM

## 2012-12-31 DIAGNOSIS — Z720 Tobacco use: Secondary | ICD-10-CM

## 2012-12-31 DIAGNOSIS — F17200 Nicotine dependence, unspecified, uncomplicated: Secondary | ICD-10-CM

## 2012-12-31 DIAGNOSIS — Z23 Encounter for immunization: Secondary | ICD-10-CM

## 2012-12-31 DIAGNOSIS — I10 Essential (primary) hypertension: Secondary | ICD-10-CM

## 2012-12-31 DIAGNOSIS — E669 Obesity, unspecified: Secondary | ICD-10-CM

## 2012-12-31 DIAGNOSIS — E119 Type 2 diabetes mellitus without complications: Secondary | ICD-10-CM

## 2012-12-31 LAB — COMPLETE METABOLIC PANEL WITH GFR
ALT: 19 U/L (ref 0–35)
AST: 17 U/L (ref 0–37)
Creat: 0.86 mg/dL (ref 0.50–1.10)
GFR, Est African American: >89 mL/min
Total Bilirubin: 0.2 mg/dL — ABNORMAL LOW (ref 0.3–1.2)

## 2012-12-31 LAB — POCT GLYCOSYLATED HEMOGLOBIN (HGB A1C): Hemoglobin A1C: 6.3

## 2012-12-31 MED ORDER — FAMOTIDINE 40 MG PO TABS: 40.0000 mg | ORAL_TABLET | Freq: Every day | ORAL | Status: DC

## 2012-12-31 NOTE — Patient Instructions (Addendum)
Stop the trazodone. I will call with lithium/blood results. Try immodium (over the counter) for diarrhea. I will send in a new prescription for heartburn.

## 2012-12-31 NOTE — Assessment & Plan Note (Addendum)
Both heartburn and diarrhea.  She is clear that the diarrhea is not related to the metformen.  Trial of both immodium and famotidine.

## 2012-12-31 NOTE — Assessment & Plan Note (Signed)
Good control

## 2012-12-31 NOTE — Assessment & Plan Note (Signed)
Recheck levels 

## 2012-12-31 NOTE — Assessment & Plan Note (Signed)
Advised to quit.  She is not ready. 

## 2012-12-31 NOTE — Assessment & Plan Note (Addendum)
Lithium level  Decent improvement.

## 2012-12-31 NOTE — Progress Notes (Signed)
  Subjective:    Patient ID: [name], female    DOB: Jul 29, 1965, 47 y.o.   MRN: [id number]  HPI Host of complaints.  She is doing better from objective standpoint. BP good.  A1C good.  C/o diarrhea - not related to metformin.  Longstanding IBS.  Also some dyspesia.  No bleeding or vomiting.   C/O continued back pain. She states she has some tremor. Present before lithium, perhaps worse on lithium. The lithium seems to be helping some.    Review of Systems     Objective:   Physical Exam Not aggitated or tearful.  Calm and well-groomed.  Seems to be coping better. Diabetic foot exam.  Normal pulses and sensation.       Assessment & Plan:

## 2013-01-01 LAB — LITHIUM LEVEL: Lithium Lvl: 0.9 mEq/L (ref 0.80–1.40)

## 2013-04-28 ENCOUNTER — Telehealth: Payer: Self-pay | Admitting: Family Medicine

## 2013-04-28 NOTE — Telephone Encounter (Signed)
Spoke with patient and she is coming in on 2/13 to see Dr. Leveda AnnaHensel

## 2013-04-28 NOTE — Telephone Encounter (Signed)
Will only be in town on Monday and there are no appts. She "really really really really" needs to see Dr Leveda Annahensel about meds adn "couple of other things" Would like for Dr Leveda Annahensel to call her

## 2013-05-13 ENCOUNTER — Ambulatory Visit: Payer: Self-pay | Admitting: Family Medicine

## 2013-05-20 ENCOUNTER — Ambulatory Visit: Payer: Self-pay | Admitting: Family Medicine

## 2013-05-25 ENCOUNTER — Ambulatory Visit (INDEPENDENT_AMBULATORY_CARE_PROVIDER_SITE_OTHER): Payer: Medicaid Other | Admitting: Family Medicine

## 2013-05-25 ENCOUNTER — Encounter: Payer: Self-pay | Admitting: Family Medicine

## 2013-05-25 VITALS — BP 134/65 | HR 82 | Temp 98.7°F | Ht 65.0 in | Wt 227.5 lb

## 2013-05-25 DIAGNOSIS — E1169 Type 2 diabetes mellitus with other specified complication: Secondary | ICD-10-CM

## 2013-05-25 DIAGNOSIS — E119 Type 2 diabetes mellitus without complications: Secondary | ICD-10-CM

## 2013-05-25 DIAGNOSIS — M159 Polyosteoarthritis, unspecified: Secondary | ICD-10-CM

## 2013-05-25 DIAGNOSIS — E669 Obesity, unspecified: Secondary | ICD-10-CM

## 2013-05-25 DIAGNOSIS — K589 Irritable bowel syndrome without diarrhea: Secondary | ICD-10-CM

## 2013-05-25 DIAGNOSIS — I10 Essential (primary) hypertension: Secondary | ICD-10-CM

## 2013-05-25 DIAGNOSIS — F319 Bipolar disorder, unspecified: Secondary | ICD-10-CM

## 2013-05-25 LAB — POCT GLYCOSYLATED HEMOGLOBIN (HGB A1C): Hemoglobin A1C: 7.4

## 2013-05-25 MED ORDER — METFORMIN HCL 1000 MG PO TABS: 1000.0000 mg | ORAL_TABLET | Freq: Two times a day (BID) | ORAL | Status: DC

## 2013-05-25 MED ORDER — CITALOPRAM HYDROBROMIDE 40 MG PO TABS: 40.0000 mg | ORAL_TABLET | Freq: Every day | ORAL | Status: AC

## 2013-05-25 MED ORDER — GABAPENTIN 600 MG PO TABS: 600.0000 mg | ORAL_TABLET | Freq: Three times a day (TID) | ORAL | Status: DC

## 2013-05-25 MED ORDER — CYCLOBENZAPRINE HCL 10 MG PO TABS: 10.0000 mg | ORAL_TABLET | Freq: Three times a day (TID) | ORAL | Status: AC

## 2013-05-25 MED ORDER — LITHIUM CARBONATE ER 300 MG PO TBCR: 300.0000 mg | EXTENDED_RELEASE_TABLET | Freq: Three times a day (TID) | ORAL | Status: AC

## 2013-05-25 MED ORDER — LISINOPRIL 10 MG PO TABS: 10.0000 mg | ORAL_TABLET | Freq: Every day | ORAL | Status: AC

## 2013-05-25 MED ORDER — ATORVASTATIN CALCIUM 20 MG PO TABS: 20.0000 mg | ORAL_TABLET | Freq: Every day | ORAL | Status: AC

## 2013-05-25 MED ORDER — METFORMIN HCL 1000 MG PO TABS: 1000.0000 mg | ORAL_TABLET | Freq: Two times a day (BID) | ORAL | Status: AC

## 2013-05-25 MED ORDER — FAMOTIDINE 40 MG PO TABS: 40.0000 mg | ORAL_TABLET | Freq: Every day | ORAL | Status: AC

## 2013-05-25 NOTE — Assessment & Plan Note (Signed)
Restart meds.
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
## 2013-05-25 NOTE — Assessment & Plan Note (Signed)
Depression worse.  Restart meds.

## 2013-05-25 NOTE — Assessment & Plan Note (Signed)
DM control surprisingly good off meds.  Should improve with restart metformin.  Improved by weight loss.

## 2013-05-25 NOTE — Progress Notes (Signed)
   Subjective:    Patient ID: [name], female    DOB: 02/14/1966, 48 y.o.   MRN: [id number]  HPI Depressed and in pain.  Off all meds x 6 weeks due to finances.  Needs new rX  Wants pain meds On the good side, has had nice intentional weight loss.   No SI or HI.  Now living with father.    Review of Systems     Objective:   Physical ExamLungs clear Cardiac RRR without m or g        Assessment & Plan:

## 2013-05-25 NOTE — Patient Instructions (Signed)
Great job on weight loss Get back on all your meds See me in one month and we'll talk about pain clinic.

## 2013-06-03 ENCOUNTER — Ambulatory Visit
Admission: RE | Admit: 2013-06-03 | Discharge: 2013-06-03 | Disposition: A | Discharge status: Home / Self Care | Payer: Medicaid Other | Source: Ambulatory Visit | Attending: Family Medicine | Admitting: Family Medicine

## 2013-06-03 DIAGNOSIS — M159 Polyosteoarthritis, unspecified: Secondary | ICD-10-CM

## 2013-06-15 ENCOUNTER — Ambulatory Visit: Payer: Self-pay | Admitting: Family Medicine

## 2013-10-05 ENCOUNTER — Ambulatory Visit: Payer: Self-pay | Admitting: Family Medicine

## 2013-10-10 ENCOUNTER — Ambulatory Visit (INDEPENDENT_AMBULATORY_CARE_PROVIDER_SITE_OTHER): Payer: Medicaid Other | Admitting: Family Medicine

## 2013-10-10 ENCOUNTER — Encounter: Payer: Self-pay | Admitting: Family Medicine

## 2013-10-10 ENCOUNTER — Ambulatory Visit: Payer: Self-pay | Admitting: Family Medicine

## 2013-10-10 VITALS — BP 113/59 | HR 82 | Temp 99.3°F | Ht 65.0 in | Wt 243.0 lb

## 2013-10-10 DIAGNOSIS — I10 Essential (primary) hypertension: Secondary | ICD-10-CM

## 2013-10-10 DIAGNOSIS — E669 Obesity, unspecified: Secondary | ICD-10-CM

## 2013-10-10 DIAGNOSIS — E1169 Type 2 diabetes mellitus with other specified complication: Secondary | ICD-10-CM

## 2013-10-10 DIAGNOSIS — E119 Type 2 diabetes mellitus without complications: Secondary | ICD-10-CM

## 2013-10-10 LAB — POCT GLYCOSYLATED HEMOGLOBIN (HGB A1C): Hemoglobin A1C: 7.4

## 2013-10-10 NOTE — Assessment & Plan Note (Signed)
A1C remains unchanged from 05/2013. Suspect most likely because of dietary habits. Patient agreed to goal of drinking a maximum of 1L of [facility] Dew. Will hopefully see some improvement.

## 2013-10-10 NOTE — Patient Instructions (Signed)
[name], it was a pleasure seeing you today. Today we talked about your diabetes and hypertension. Your blood pressure was great today (113/59). I will not make any changes to your medication. Your A1C was 7.4 today which is unchanged from February. We discussed some lifestyle modifications. One new goal is to drink less than 1L of [facility] per day. I believe small/large steps like this will really help get your A1C down to a healthy level.  Please schedule an appointment to see Dr. Leveda AnnaHensel in 3 months for a follow-up.  If you have any questions or concerns, please do not hesitate to call the office at [phone].  Sincerely,  Jacquelin Hawkingalph Aleesa Sweigert, MD

## 2013-10-10 NOTE — Progress Notes (Addendum)
   Subjective:    Patient ID: [name], female    DOB: 04/11/1965, 48 y.o.   MRN: [id number]  HPI  Patient presents today for follow-up of diabetes mellitus and hypertension.   Hypertension: Patient taking lisinopril and is adherent with regimen. Blood pressure has been well controlled. Pertinent negatives include chest pain and shortness of breath.  Diabetes mellitus, type 2: Last A1C was 7.4. Patient currently adherent with metformin. She has been drinking at least 2L of [facility]ountain Dew. She drinks it for the taste and caffeine. She has tried diet but does not like it. She takes her blood sugar at random times with ranges in the 130-140.   Social history: smoking use reviewed  Review of Systems  Eyes: Negative for visual disturbance.  Respiratory: Negative for shortness of breath.   Cardiovascular: Negative for chest pain, palpitations and leg swelling.  Gastrointestinal: Negative for nausea and vomiting.  Neurological: Negative for seizures.       Objective:   Physical Exam  Constitutional: She appears well-developed and well-nourished.  Cardiovascular: Normal rate and regular rhythm.   Pulmonary/Chest: Effort normal and breath sounds normal.    Lab Results  Component Value Date   HGBA1C 7.4 10/10/2013   HGBA1C 7.4 05/25/2013   HGBA1C 6.3 12/31/2012        Assessment & Plan:

## 2013-10-10 NOTE — Assessment & Plan Note (Signed)
Blood pressure controlled today. Continue current regimen.

## 2013-12-02 ENCOUNTER — Encounter: Payer: Self-pay | Admitting: Family Medicine

## 2013-12-02 ENCOUNTER — Ambulatory Visit (INDEPENDENT_AMBULATORY_CARE_PROVIDER_SITE_OTHER): Payer: Medicaid Other | Admitting: Family Medicine

## 2013-12-02 VITALS — BP 135/73 | HR 104 | Ht 65.0 in | Wt 245.1 lb

## 2013-12-02 DIAGNOSIS — E1140 Type 2 diabetes mellitus with diabetic neuropathy, unspecified: Secondary | ICD-10-CM | POA: Insufficient documentation

## 2013-12-02 DIAGNOSIS — M159 Polyosteoarthritis, unspecified: Secondary | ICD-10-CM

## 2013-12-02 DIAGNOSIS — R7402 Elevation of levels of lactic acid dehydrogenase (LDH): Secondary | ICD-10-CM

## 2013-12-02 DIAGNOSIS — M150 Primary generalized (osteo)arthritis: Secondary | ICD-10-CM

## 2013-12-02 DIAGNOSIS — E1149 Type 2 diabetes mellitus with other diabetic neurological complication: Secondary | ICD-10-CM

## 2013-12-02 DIAGNOSIS — E1142 Type 2 diabetes mellitus with diabetic polyneuropathy: Secondary | ICD-10-CM

## 2013-12-02 DIAGNOSIS — F319 Bipolar disorder, unspecified: Secondary | ICD-10-CM

## 2013-12-02 DIAGNOSIS — E1169 Type 2 diabetes mellitus with other specified complication: Secondary | ICD-10-CM

## 2013-12-02 DIAGNOSIS — E119 Type 2 diabetes mellitus without complications: Secondary | ICD-10-CM

## 2013-12-02 DIAGNOSIS — E7800 Pure hypercholesterolemia, unspecified: Secondary | ICD-10-CM | POA: Insufficient documentation

## 2013-12-02 DIAGNOSIS — M545 Low back pain, unspecified: Secondary | ICD-10-CM

## 2013-12-02 DIAGNOSIS — R740 Nonspecific elevation of levels of transaminase and lactic acid dehydrogenase [LDH]: Secondary | ICD-10-CM

## 2013-12-02 DIAGNOSIS — Z23 Encounter for immunization: Secondary | ICD-10-CM

## 2013-12-02 DIAGNOSIS — E669 Obesity, unspecified: Secondary | ICD-10-CM

## 2013-12-02 DIAGNOSIS — R7401 Elevation of levels of liver transaminase levels: Secondary | ICD-10-CM

## 2013-12-02 LAB — POCT GLYCOSYLATED HEMOGLOBIN (HGB A1C): Hemoglobin A1C: 7.2

## 2013-12-02 MED ORDER — TRAMADOL HCL 50 MG PO TABS: 50.0000 mg | ORAL_TABLET | Freq: Three times a day (TID) | ORAL | Status: AC | PRN

## 2013-12-02 MED ORDER — GABAPENTIN 600 MG PO TABS: 600.0000 mg | ORAL_TABLET | Freq: Three times a day (TID) | ORAL | Status: DC

## 2013-12-02 NOTE — Progress Notes (Signed)
   Subjective:    Patient ID: [name], female    DOB: [birth year], 48 y.o.   MRN: [id number]  HPI [phone] is cell.  She is doing OK except for pain.  Same pain in both hips and low back.  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  Mostly working out of [employer].   The out of town work is good for her.  Less exposure to her old friendship circle and all the drug abuse that brings. Needs eye camera Needs foot exam - complains of tingling both feet. No change in diet or exercise. Needs flu shot.    Review of Systems     Objective:   Physical Exam Lungs clear Cardiac RRR without m or g See diabetic foot exam       Assessment & Plan:

## 2013-12-02 NOTE — Assessment & Plan Note (Signed)
Unchanged.  Will trust with tramadol

## 2013-12-02 NOTE — Assessment & Plan Note (Signed)
Check labs.  Stay on statin.

## 2013-12-02 NOTE — Assessment & Plan Note (Signed)
Seems stable today - good place last 6 months.

## 2013-12-02 NOTE — Assessment & Plan Note (Signed)
Check labs include HIV and hep c

## 2013-12-02 NOTE — Assessment & Plan Note (Signed)
gabapentin

## 2013-12-02 NOTE — Assessment & Plan Note (Signed)
Check A1c. 

## 2013-12-02 NOTE — Patient Instructions (Signed)
Stay clean and sober See me in three months. I will call with lab work By our list, gabapentin is cheapest at [employer].

## 2013-12-03 LAB — COMPREHENSIVE METABOLIC PANEL
ALBUMIN: 4.1 g/dL (ref 3.5–5.2)
ALT: 16 U/L (ref 0–35)
AST: 15 U/L (ref 0–37)
Alkaline Phosphatase: 61 U/L (ref 39–117)
BUN: 8 mg/dL (ref 6–23)
CO2: 21 mEq/L (ref 19–32)
Calcium: 9.2 mg/dL (ref 8.4–10.5)
Chloride: 103 mEq/L (ref 96–112)
Creat: 0.66 mg/dL (ref 0.50–1.10)
GLUCOSE: 237 mg/dL — AB (ref 70–99)
POTASSIUM: 4.3 meq/L (ref 3.5–5.3)
SODIUM: 136 meq/L (ref 135–145)
TOTAL PROTEIN: 6.6 g/dL (ref 6.0–8.3)
Total Bilirubin: 0.3 mg/dL (ref 0.2–1.2)

## 2013-12-03 LAB — HIV ANTIBODY (ROUTINE TESTING W REFLEX): HIV 1&2 Ab, 4th Generation: NONREACTIVE

## 2013-12-03 LAB — LIPID PANEL
Cholesterol: 201 mg/dL — ABNORMAL HIGH (ref 0–200)
HDL: 43 mg/dL (ref >39)
LDL CALC: 110 mg/dL — AB (ref 0–99)
TRIGLYCERIDES: 240 mg/dL — AB (ref <150)
Total CHOL/HDL Ratio: 4.7 Ratio
VLDL: 48 mg/dL — ABNORMAL HIGH (ref 0–40)

## 2013-12-03 LAB — HEPATITIS C ANTIBODY, REFLEX: HCV Ab: REACTIVE — AB

## 2013-12-06 ENCOUNTER — Telehealth: Payer: Self-pay | Admitting: Family Medicine

## 2013-12-06 LAB — HEPATITIS C VRS RNA DETECT BY PCR-QUAL: Hepatitis C Vrs RNA by PCR-Qual: NEGATIVE

## 2013-12-06 MED ORDER — GABAPENTIN 300 MG PO CAPS: 600.0000 mg | ORAL_CAPSULE | Freq: Three times a day (TID) | ORAL | Status: AC

## 2013-12-07 ENCOUNTER — Encounter: Payer: Self-pay | Admitting: Family Medicine

## 2013-12-07 ENCOUNTER — Other Ambulatory Visit: Payer: Self-pay | Admitting: Family Medicine

## 2013-12-07 MED ORDER — ASPIRIN EC 81 MG PO TBEC: 81.0000 mg | DELAYED_RELEASE_TABLET | Freq: Every day | ORAL | Status: AC

## 2013-12-07 NOTE — Telephone Encounter (Signed)
Left voice message informing pt that Gabapentin was sent in as capsules and not tablets.  Tablets are on the non-preferred lis for medicaid.  Clovis Pu, RN

## 2013-12-14 ENCOUNTER — Telehealth: Payer: Self-pay | Admitting: Family Medicine

## 2013-12-14 NOTE — Telephone Encounter (Signed)
Husband likely died of an unintentional OD of combo of diazepam and narcotics.  Called, supported and answered questions.

## 2013-12-14 NOTE — Telephone Encounter (Signed)
Please call patient back asap.  Husband passed away on 08/08/2022 and want to talk with you about toxicology.  Want to know what that entails.

## 2014-01-11 ENCOUNTER — Ambulatory Visit: Payer: Self-pay | Admitting: Family Medicine

## 2014-05-02 ENCOUNTER — Emergency Department (HOSPITAL_BASED_OUTPATIENT_CLINIC_OR_DEPARTMENT_OTHER)
Admission: EM | Admit: 2014-05-02 | Discharge: 2014-05-02 | Disposition: A | Discharge status: Home / Self Care | Payer: Medicaid Other | Attending: Emergency Medicine | Admitting: Emergency Medicine

## 2014-05-02 ENCOUNTER — Encounter (HOSPITAL_BASED_OUTPATIENT_CLINIC_OR_DEPARTMENT_OTHER): Payer: Self-pay | Admitting: *Deleted

## 2014-05-02 DIAGNOSIS — Z3202 Encounter for pregnancy test, result negative: Secondary | ICD-10-CM | POA: Diagnosis not present

## 2014-05-02 DIAGNOSIS — F319 Bipolar disorder, unspecified: Secondary | ICD-10-CM | POA: Diagnosis not present

## 2014-05-02 DIAGNOSIS — Z7982 Long term (current) use of aspirin: Secondary | ICD-10-CM | POA: Diagnosis not present

## 2014-05-02 DIAGNOSIS — N390 Urinary tract infection, site not specified: Secondary | ICD-10-CM | POA: Diagnosis not present

## 2014-05-02 DIAGNOSIS — E119 Type 2 diabetes mellitus without complications: Secondary | ICD-10-CM | POA: Diagnosis not present

## 2014-05-02 DIAGNOSIS — E785 Hyperlipidemia, unspecified: Secondary | ICD-10-CM | POA: Diagnosis not present

## 2014-05-02 DIAGNOSIS — R000 Tachycardia, unspecified: Secondary | ICD-10-CM | POA: Diagnosis not present

## 2014-05-02 DIAGNOSIS — Z79899 Other long term (current) drug therapy: Secondary | ICD-10-CM | POA: Insufficient documentation

## 2014-05-02 DIAGNOSIS — Z720 Tobacco use: Secondary | ICD-10-CM | POA: Diagnosis not present

## 2014-05-02 DIAGNOSIS — R300 Dysuria: Secondary | ICD-10-CM | POA: Diagnosis present

## 2014-05-02 LAB — URINALYSIS, ROUTINE W REFLEX MICROSCOPIC
BILIRUBIN URINE: NEGATIVE
GLUCOSE, UA: 500 mg/dL — AB
Ketones, ur: NEGATIVE mg/dL
NITRITE: POSITIVE — AB
PROTEIN: 100 mg/dL — AB
Specific Gravity, Urine: 1.018 (ref 1.005–1.030)
Urobilinogen, UA: 0.2 mg/dL (ref 0.0–1.0)
pH: 5.5 (ref 5.0–8.0)

## 2014-05-02 LAB — CBC WITH DIFFERENTIAL/PLATELET
Basophils Absolute: 0 10*3/uL (ref 0.0–0.1)
Basophils Relative: 0 % (ref 0–1)
EOS PCT: 0 % (ref 0–5)
Eosinophils Absolute: 0.1 10*3/uL (ref 0.0–0.7)
HEMATOCRIT: 41.9 % (ref 36.0–46.0)
Hemoglobin: 13.9 g/dL (ref 12.0–15.0)
Lymphocytes Relative: 11 % — ABNORMAL LOW (ref 12–46)
Lymphs Abs: 1.3 10*3/uL (ref 0.7–4.0)
MCH: 30.7 pg (ref 26.0–34.0)
MCHC: 33.2 g/dL (ref 30.0–36.0)
MCV: 92.5 fL (ref 78.0–100.0)
MONOS PCT: 1 % — AB (ref 3–12)
Monocytes Absolute: 0.1 10*3/uL (ref 0.1–1.0)
NEUTROS ABS: 10 10*3/uL — AB (ref 1.7–7.7)
Neutrophils Relative %: 88 % — ABNORMAL HIGH (ref 43–77)
PLATELETS: 235 10*3/uL (ref 150–400)
RBC: 4.53 MIL/uL (ref 3.87–5.11)
RDW: 13.7 % (ref 11.5–15.5)
WBC: 11.4 10*3/uL — ABNORMAL HIGH (ref 4.0–10.5)

## 2014-05-02 LAB — I-STAT CG4 LACTIC ACID, ED: Lactic Acid, Venous: 1.5 mmol/L (ref 0.5–2.0)

## 2014-05-02 LAB — URINE MICROSCOPIC-ADD ON

## 2014-05-02 LAB — BASIC METABOLIC PANEL
ANION GAP: 6 (ref 5–15)
BUN: 6 mg/dL (ref 6–23)
CO2: 26 mmol/L (ref 19–32)
Calcium: 8.7 mg/dL (ref 8.4–10.5)
Chloride: 103 mmol/L (ref 96–112)
Creatinine, Ser: 0.77 mg/dL (ref 0.50–1.10)
GFR calc non Af Amer: >90 mL/min (ref >90)
Glucose, Bld: 155 mg/dL — ABNORMAL HIGH (ref 70–99)
POTASSIUM: 4 mmol/L (ref 3.5–5.1)
Sodium: 135 mmol/L (ref 135–145)

## 2014-05-02 LAB — PREGNANCY, URINE: PREG TEST UR: NEGATIVE

## 2014-05-02 MED ORDER — CEPHALEXIN 500 MG PO CAPS: 500.0000 mg | ORAL_CAPSULE | Freq: Two times a day (BID) | ORAL | Status: AC

## 2014-05-02 MED ORDER — DEXTROSE 5 % IV SOLN
1.0000 g | Freq: Once | INTRAVENOUS | Status: AC
  Administered 2014-05-02: 1 g via INTRAVENOUS

## 2014-05-02 MED ORDER — CEFTRIAXONE SODIUM 1 G IJ SOLR
INTRAMUSCULAR | Status: AC
  Filled 2014-05-02: qty 10

## 2014-05-02 MED ORDER — OXYCODONE-ACETAMINOPHEN 5-325 MG PO TABS
1.0000 | ORAL_TABLET | Freq: Four times a day (QID) | ORAL | Status: AC | PRN
Start: 2014-05-02 — End: ?

## 2014-05-02 MED ORDER — ONDANSETRON HCL 4 MG PO TABS: 4.0000 mg | ORAL_TABLET | Freq: Four times a day (QID) | ORAL | Status: AC

## 2014-05-02 MED ORDER — IBUPROFEN 400 MG PO TABS
600.0000 mg | ORAL_TABLET | Freq: Once | ORAL | Status: AC
  Administered 2014-05-02: 600 mg via ORAL
  Filled 2014-05-02 (×2): qty 1

## 2014-05-02 MED ORDER — FLUCONAZOLE 150 MG PO TABS: 150.0000 mg | ORAL_TABLET | Freq: Every day | ORAL | Status: AC

## 2014-05-02 MED ORDER — OXYCODONE-ACETAMINOPHEN 5-325 MG PO TABS
1.0000 | ORAL_TABLET | Freq: Once | ORAL | Status: AC
  Administered 2014-05-02: 1 via ORAL
  Filled 2014-05-02: qty 1

## 2014-05-02 MED ORDER — SODIUM CHLORIDE 0.9 % IV BOLUS (SEPSIS)
1000.0000 mL | Freq: Once | INTRAVENOUS | Status: AC
  Administered 2014-05-02: 1000 mL via INTRAVENOUS

## 2014-05-02 NOTE — ED Provider Notes (Signed)
CSN: [id number]     Arrival date & time 05/02/14  1949 History  This chart was scribed for Tilden FossaElizabeth Faylene Allerton, MD by Annye AsaAnna Dorsett, ED Scribe. This patient was seen in room MH10/MH10 and the patient's care was started at 8:01 PM.    Chief Complaint  Patient presents with  . Dysuria   The history is provided by the patient. No language interpreter was used.     HPI Comments: [name] is a 49 y.o. female with past medical history of HLD, DM, HTN who presents to the Emergency Department complaining of 8 days of constant dysuria, intermittent hematuria (described as "strings of blood) and increased urinary frequency. She also reports gradually worsening low abdominal pain (worse with movement) and chills. She denies fevers, cough, vomiting, abnormal back pain, or history of kidney stones. Symptoms are moderate, constant, worsening. Patient reports history of high heart rate.  Past Medical History  Diagnosis Date  . Diabetes mellitus   . High cholesterol   . Depression   . Bipolar 1 disorder    Past Surgical History  Procedure Laterality Date  . Arm surgery    . Knee surgery    . Appendectomy    . Cholecystectomy    . Cesarean section    . Neck surgery     No family history on file. History  Substance Use Topics  . Smoking status: Current Every Day Smoker -- 1.00 packs/day    Types: Cigarettes  . Smokeless tobacco: Not on file  . Alcohol Use: No   OB History    No data available     Review of Systems  Constitutional: Positive for chills. Negative for fever.  Respiratory: Negative for cough.   Gastrointestinal: Positive for abdominal pain. Negative for vomiting.  Genitourinary: Positive for dysuria and hematuria.  Musculoskeletal: Negative for back pain.  All other systems reviewed and are negative.  Allergies  Methadone hcl; Morphine and related; and Propranolol  Home Medications   Prior to Admission medications   Medication Sig Start Date End Date Taking?  Authorizing Provider  aspirin EC 81 MG tablet Take 1 tablet (81 mg total) by mouth daily. 12/07/13   Sanjuana LettersWilliam Arthur Hensel, MD  atorvastatin (LIPITOR) 20 MG tablet Take 1 tablet (20 mg total) by mouth daily. For cholesterol control 05/25/13   Sanjuana LettersWilliam Arthur Hensel, MD  citalopram (CELEXA) 40 MG tablet Take 1 tablet (40 mg total) by mouth daily. For depression 05/25/13   Sanjuana LettersWilliam Arthur Hensel, MD  cyclobenzaprine (FLEXERIL) 10 MG tablet Take 1 tablet (10 mg total) by mouth 3 (three) times daily. 05/25/13   Sanjuana LettersWilliam Arthur Hensel, MD  famotidine (PEPCID) 40 MG tablet Take 1 tablet (40 mg total) by mouth daily. 05/25/13   Sanjuana LettersWilliam Arthur Hensel, MD  gabapentin (NEURONTIN) 300 MG capsule Take 2 capsules (600 mg total) by mouth 3 (three) times daily. 12/06/13   Sanjuana LettersWilliam Arthur Hensel, MD  lisinopril (PRINIVIL,ZESTRIL) 10 MG tablet Take 1 tablet (10 mg total) by mouth daily. For high blood pressure control 05/25/13   Sanjuana LettersWilliam Arthur Hensel, MD  lithium carbonate (LITHOBID) 300 MG CR tablet Take 1 tablet (300 mg total) by mouth 3 (three) times daily. 05/25/13   Sanjuana LettersWilliam Arthur Hensel, MD  metFORMIN (GLUCOPHAGE) 1000 MG tablet Take 1 tablet (1,000 mg total) by mouth 2 (two) times daily with a meal. For diabetes control 05/25/13 05/25/14  Sanjuana LettersWilliam Arthur Hensel, MD  traMADol (ULTRAM) 50 MG tablet Take 1 tablet (50 mg total) by mouth every 8 (  eight) hours as needed. 12/02/13   Sanjuana Letters, MD   BP 154/81 mmHg  Pulse 120  Temp(Src) 98.5 F (36.9 C) (Oral)  Resp 20  Ht  (1.651 m)  Wt 245 lb (111.131 kg)  BMI 40.77 kg/m2  SpO2 97%  LMP 02/28/2014 Physical Exam  Constitutional: She is oriented to person, place, and time. She appears well-developed and well-nourished.  HENT:  Head: Normocephalic and atraumatic.  Cardiovascular: Regular rhythm.   No murmur heard. Tachycardic  Pulmonary/Chest: Effort normal and breath sounds normal. No respiratory distress.  Abdominal: Soft. There is tenderness (Mild  suprapubic). There is no rebound and no guarding.  Musculoskeletal: She exhibits no edema or tenderness.  Neurological: She is alert and oriented to person, place, and time.  Skin: Skin is warm and dry.  Psychiatric: She has a normal mood and affect. Her behavior is normal.  Nursing note and vitals reviewed.   ED Course  Procedures   DIAGNOSTIC STUDIES: Oxygen Saturation is 97% on RA, normal by my interpretation.    COORDINATION OF CARE: 8:09 PM Discussed treatment plan with pt at bedside and pt agreed to plan.   Labs Review Labs Reviewed  URINALYSIS, ROUTINE W REFLEX MICROSCOPIC - Abnormal; Notable for the following:    APPearance CLOUDY (*)    Glucose, UA 500 (*)    Hgb urine dipstick MODERATE (*)    Protein, ur 100 (*)    Nitrite POSITIVE (*)    Leukocytes, UA LARGE (*)    All other components within normal limits  BASIC METABOLIC PANEL - Abnormal; Notable for the following:    Glucose, Bld 155 (*)    All other components within normal limits  CBC WITH DIFFERENTIAL/PLATELET - Abnormal; Notable for the following:    WBC 11.4 (*)    Neutrophils Relative % 88 (*)    Neutro Abs 10.0 (*)    Lymphocytes Relative 11 (*)    Monocytes Relative 1 (*)    All other components within normal limits  URINE MICROSCOPIC-ADD ON - Abnormal; Notable for the following:    Squamous Epithelial / LPF FEW (*)    Bacteria, UA MANY (*)    All other components within normal limits  URINE CULTURE  PREGNANCY, URINE  I-STAT CG4 LACTIC ACID, ED    Imaging Review No results found.   EKG Interpretation None      MDM   Final diagnoses:  Acute UTI   Patient here for evaluation of chills and dysuria. Patient has acute UTI with fever in the emergency department. Clinical picture is not consistent with obstructing stone. Patient was given IV fluids and antibiotics in the emergency department with improvement in her symptoms. Discussed with patient option of admission given fever and  tachycardia with infection in the emergency department and patient prefers discharge home as she has a court date in the morning. Discussed with patient very close return precautions for any worsening symptoms or new symptoms. Patient is tolerating oral fluids without difficulty and has had no vomiting at home.  I personally performed the services described in this documentation, which was scribed in my presence. The recorded information has been reviewed and considered.     Tilden Fossa, MD 05/02/14 [phone]

## 2014-05-02 NOTE — ED Notes (Signed)
Crackers, cheese and peanut butter offered per pt request.

## 2014-05-02 NOTE — ED Notes (Signed)
C/o blood in urine, burning w urination body aches x 7 days w chills x 1 day

## 2014-05-02 NOTE — Discharge Instructions (Signed)

## 2014-05-02 NOTE — ED Notes (Addendum)
Dysuria and hematuria x 8 days. chills

## 2014-05-04 LAB — URINE CULTURE

## 2014-05-05 ENCOUNTER — Telehealth (HOSPITAL_COMMUNITY): Payer: Self-pay

## 2014-05-05 NOTE — ED Notes (Signed)
Post ED Visit - Positive Culture Follow-up  Culture report reviewed by antimicrobial stewardship pharmacist: []  Wes Dulaney, Pharm.D., BCPS [x]  Celedonio MiyamotoJeremy Frens, Pharm.D., BCPS []  Georgina PillionElizabeth Martin, Pharm.D., BCPS []  KraemerMinh Pham, [address] BCPS, AAHIVP []  Estella HuskMichelle Turner, Pharm.D., BCPS, AAHIVP []  Elder CyphersLorie Poole, [address]Pharm[address]D., BCPS  Positive urine culture Treated with cephalexin, organism sensitive to the same and no further patient follow-up is required at this time.  Ashley JacobsFesterman, Maximo Spratling C 05/05/2014, 5:46 PM

## 2018-06-02 ENCOUNTER — Encounter: Payer: Self-pay | Admitting: Internal Medicine
